# Patient Record
Sex: Female | Born: 1960
Health system: Southern US, Community
[De-identification: ages and names within clinical notes are randomized; demographics above are authoritative.]

## PROBLEM LIST (undated history)

## (undated) DIAGNOSIS — T8859XA Other complications of anesthesia, initial encounter: Secondary | ICD-10-CM

## (undated) DIAGNOSIS — I499 Cardiac arrhythmia, unspecified: Secondary | ICD-10-CM

## (undated) DIAGNOSIS — T4145XA Adverse effect of unspecified anesthetic, initial encounter: Secondary | ICD-10-CM

## (undated) DIAGNOSIS — L0390 Cellulitis, unspecified: Secondary | ICD-10-CM

## (undated) DIAGNOSIS — M1990 Unspecified osteoarthritis, unspecified site: Secondary | ICD-10-CM

## (undated) DIAGNOSIS — I10 Essential (primary) hypertension: Secondary | ICD-10-CM

## (undated) DIAGNOSIS — I639 Cerebral infarction, unspecified: Secondary | ICD-10-CM

## (undated) DIAGNOSIS — D649 Anemia, unspecified: Secondary | ICD-10-CM

## (undated) DIAGNOSIS — Z9289 Personal history of other medical treatment: Secondary | ICD-10-CM

## (undated) DIAGNOSIS — R112 Nausea with vomiting, unspecified: Secondary | ICD-10-CM

## (undated) DIAGNOSIS — E0590 Thyrotoxicosis, unspecified without thyrotoxic crisis or storm: Secondary | ICD-10-CM

## (undated) DIAGNOSIS — Z87442 Personal history of urinary calculi: Secondary | ICD-10-CM

## (undated) DIAGNOSIS — Z9889 Other specified postprocedural states: Secondary | ICD-10-CM

## (undated) HISTORY — PX: OTHER SURGICAL HISTORY: SHX169

## (undated) HISTORY — PX: CARPAL TUNNEL RELEASE: SHX101

## (undated) HISTORY — PX: ABDOMINAL HYSTERECTOMY: SHX81

## (undated) HISTORY — PX: TONSILLECTOMY: SUR1361

## (undated) HISTORY — PX: KNEE SURGERY: SHX244

---

## 1998-09-02 ENCOUNTER — Ambulatory Visit (HOSPITAL_COMMUNITY): Admission: RE | Admit: 1998-09-02 | Discharge: 1998-09-02 | Payer: Self-pay | Admitting: Gastroenterology

## 1999-10-06 ENCOUNTER — Other Ambulatory Visit: Admission: RE | Admit: 1999-10-06 | Discharge: 1999-10-06 | Payer: Self-pay | Admitting: Obstetrics and Gynecology

## 1999-11-02 ENCOUNTER — Encounter: Admission: RE | Admit: 1999-11-02 | Discharge: 1999-11-02 | Payer: Self-pay | Admitting: Obstetrics and Gynecology

## 1999-11-02 ENCOUNTER — Encounter: Payer: Self-pay | Admitting: Obstetrics and Gynecology

## 2000-11-28 ENCOUNTER — Other Ambulatory Visit: Admission: RE | Admit: 2000-11-28 | Discharge: 2000-11-28 | Payer: Self-pay | Admitting: Obstetrics and Gynecology

## 2000-12-18 ENCOUNTER — Encounter: Payer: Self-pay | Admitting: Obstetrics and Gynecology

## 2000-12-18 ENCOUNTER — Encounter: Admission: RE | Admit: 2000-12-18 | Discharge: 2000-12-18 | Payer: Self-pay | Admitting: Obstetrics and Gynecology

## 2001-08-27 ENCOUNTER — Other Ambulatory Visit: Admission: RE | Admit: 2001-08-27 | Discharge: 2001-08-27 | Payer: Self-pay | Admitting: Obstetrics and Gynecology

## 2001-09-02 ENCOUNTER — Encounter: Payer: Self-pay | Admitting: Obstetrics and Gynecology

## 2001-09-02 ENCOUNTER — Ambulatory Visit (HOSPITAL_COMMUNITY): Admission: RE | Admit: 2001-09-02 | Discharge: 2001-09-02 | Payer: Self-pay | Admitting: Obstetrics and Gynecology

## 2002-12-30 ENCOUNTER — Other Ambulatory Visit: Admission: RE | Admit: 2002-12-30 | Discharge: 2002-12-30 | Payer: Self-pay | Admitting: Obstetrics and Gynecology

## 2003-01-11 ENCOUNTER — Encounter: Admission: RE | Admit: 2003-01-11 | Discharge: 2003-01-11 | Payer: Self-pay | Admitting: Obstetrics and Gynecology

## 2003-01-11 ENCOUNTER — Encounter: Payer: Self-pay | Admitting: Obstetrics and Gynecology

## 2004-03-21 ENCOUNTER — Encounter: Admission: RE | Admit: 2004-03-21 | Discharge: 2004-03-21 | Payer: Self-pay | Admitting: Obstetrics and Gynecology

## 2004-04-07 ENCOUNTER — Other Ambulatory Visit: Admission: RE | Admit: 2004-04-07 | Discharge: 2004-04-07 | Payer: Self-pay | Admitting: Obstetrics and Gynecology

## 2005-05-07 ENCOUNTER — Ambulatory Visit (HOSPITAL_COMMUNITY): Admission: RE | Admit: 2005-05-07 | Discharge: 2005-05-07 | Payer: Self-pay | Admitting: Obstetrics and Gynecology

## 2005-05-09 ENCOUNTER — Other Ambulatory Visit: Admission: RE | Admit: 2005-05-09 | Discharge: 2005-05-09 | Payer: Self-pay | Admitting: Obstetrics and Gynecology

## 2006-05-16 ENCOUNTER — Ambulatory Visit (HOSPITAL_COMMUNITY): Admission: RE | Admit: 2006-05-16 | Discharge: 2006-05-16 | Payer: Self-pay | Admitting: Obstetrics and Gynecology

## 2007-07-03 ENCOUNTER — Ambulatory Visit (HOSPITAL_COMMUNITY): Admission: RE | Admit: 2007-07-03 | Discharge: 2007-07-03 | Payer: Self-pay | Admitting: Obstetrics and Gynecology

## 2007-11-25 ENCOUNTER — Encounter (HOSPITAL_COMMUNITY): Admission: RE | Admit: 2007-11-25 | Discharge: 2007-12-15 | Payer: Self-pay | Admitting: Radiology

## 2007-11-28 ENCOUNTER — Ambulatory Visit: Payer: Self-pay | Admitting: Cardiology

## 2007-12-02 ENCOUNTER — Encounter: Payer: Self-pay | Admitting: Cardiology

## 2007-12-02 ENCOUNTER — Ambulatory Visit (HOSPITAL_COMMUNITY): Admission: RE | Admit: 2007-12-02 | Discharge: 2007-12-02 | Payer: Self-pay | Admitting: Cardiology

## 2007-12-02 ENCOUNTER — Ambulatory Visit: Payer: Self-pay | Admitting: Cardiology

## 2008-07-06 ENCOUNTER — Ambulatory Visit (HOSPITAL_COMMUNITY): Admission: RE | Admit: 2008-07-06 | Discharge: 2008-07-06 | Payer: Self-pay | Admitting: Obstetrics and Gynecology

## 2009-05-05 ENCOUNTER — Emergency Department (HOSPITAL_COMMUNITY): Admission: EM | Admit: 2009-05-05 | Discharge: 2009-05-05 | Payer: Self-pay | Admitting: Emergency Medicine

## 2009-07-12 ENCOUNTER — Ambulatory Visit (HOSPITAL_COMMUNITY): Admission: RE | Admit: 2009-07-12 | Discharge: 2009-07-12 | Payer: Self-pay | Admitting: Obstetrics and Gynecology

## 2009-10-05 ENCOUNTER — Encounter (HOSPITAL_COMMUNITY): Admission: RE | Admit: 2009-10-05 | Discharge: 2009-11-04 | Payer: Self-pay | Admitting: Sports Medicine

## 2009-12-27 ENCOUNTER — Ambulatory Visit (HOSPITAL_COMMUNITY): Admission: RE | Admit: 2009-12-27 | Discharge: 2009-12-27 | Payer: Self-pay | Admitting: Obstetrics and Gynecology

## 2010-04-09 ENCOUNTER — Encounter: Payer: Self-pay | Admitting: Obstetrics and Gynecology

## 2010-04-10 ENCOUNTER — Encounter: Payer: Self-pay | Admitting: Internal Medicine

## 2010-06-01 LAB — COMPREHENSIVE METABOLIC PANEL
Albumin: 3.8 g/dL (ref 3.5–5.2)
CO2: 27 mEq/L (ref 19–32)
Calcium: 9 mg/dL (ref 8.4–10.5)
Creatinine, Ser: 0.75 mg/dL (ref 0.4–1.2)
GFR calc Af Amer: >60 mL/min (ref >60)
Potassium: 3.5 mEq/L (ref 3.5–5.1)
Total Bilirubin: 0.3 mg/dL (ref 0.3–1.2)

## 2010-06-01 LAB — CBC
Hemoglobin: 12.1 g/dL (ref 12.0–15.0)
Platelets: 280 10*3/uL (ref 150–400)
RBC: 3.81 MIL/uL — ABNORMAL LOW (ref 3.87–5.11)

## 2010-06-05 ENCOUNTER — Other Ambulatory Visit (HOSPITAL_COMMUNITY): Payer: Self-pay | Admitting: Obstetrics and Gynecology

## 2010-06-05 DIAGNOSIS — Z139 Encounter for screening, unspecified: Secondary | ICD-10-CM

## 2010-06-07 LAB — URINALYSIS, ROUTINE W REFLEX MICROSCOPIC
Hgb urine dipstick: NEGATIVE
Specific Gravity, Urine: 1.025 (ref 1.005–1.030)
pH: 6 (ref 5.0–8.0)

## 2010-06-07 LAB — URINE MICROSCOPIC-ADD ON

## 2010-06-07 LAB — PREGNANCY, URINE: Preg Test, Ur: NEGATIVE

## 2010-06-26 ENCOUNTER — Ambulatory Visit (HOSPITAL_COMMUNITY)
Admission: AD | Admit: 2010-06-26 | Discharge: 2010-06-27 | Disposition: A | Discharge status: Home / Self Care | Payer: BC Managed Care – PPO | Source: Ambulatory Visit | Attending: Obstetrics and Gynecology | Admitting: Obstetrics and Gynecology

## 2010-06-26 DIAGNOSIS — D649 Anemia, unspecified: Secondary | ICD-10-CM | POA: Insufficient documentation

## 2010-06-26 DIAGNOSIS — N920 Excessive and frequent menstruation with regular cycle: Secondary | ICD-10-CM | POA: Insufficient documentation

## 2010-06-26 DIAGNOSIS — R55 Syncope and collapse: Secondary | ICD-10-CM | POA: Insufficient documentation

## 2010-06-26 LAB — COMPREHENSIVE METABOLIC PANEL
ALT: 15 U/L (ref 0–35)
Chloride: 104 mEq/L (ref 96–112)
Creatinine, Ser: 0.7 mg/dL (ref 0.4–1.2)
GFR calc Af Amer: >60 mL/min (ref >60)
GFR calc non Af Amer: >60 mL/min (ref >60)
Sodium: 136 mEq/L (ref 135–145)
Total Bilirubin: 0.3 mg/dL (ref 0.3–1.2)

## 2010-06-26 LAB — ABO/RH: ABO/RH(D): A POS

## 2010-06-26 LAB — CBC
Hemoglobin: 5.9 g/dL — CL (ref 12.0–15.0)
Platelets: 223 10*3/uL (ref 150–400)
RBC: 2.13 MIL/uL — ABNORMAL LOW (ref 3.87–5.11)

## 2010-06-27 LAB — TYPE AND SCREEN
ABO/RH(D): A POS
Antibody Screen: NEGATIVE
Unit division: 0
Unit division: 0

## 2010-06-27 LAB — HEMOGLOBIN AND HEMATOCRIT, BLOOD
HCT: 21.1 % — ABNORMAL LOW (ref 36.0–46.0)
Hemoglobin: 7.3 g/dL — ABNORMAL LOW (ref 12.0–15.0)

## 2010-06-29 NOTE — H&P (Signed)
[name], [name]                 ACCOUNT NO.:  [id number]  MEDICAL RECORD NO.:  [id number]           PATIENT TYPE:  O  LOCATION:  9304                          FACILITY:  [facility]  PHYSICIAN:  Malachi Pro. Ambrose Mantle, M.D. DATE OF BIRTH:  11-Jan-1961  DATE OF ADMISSION:  06/26/2010 DATE OF DISCHARGE:                             HISTORY & PHYSICAL   HISTORY OF PRESENT ILLNESS:  This is a 50 year old white married female para 3-0-1-3 with abnormal bleeding since January 2012.  The patient was actually scheduled for NovaSure oblation of the uterus in October 2011, but canceled the procedure, never came back to the office for rescheduling and began bleeding in January and has never stopped, but never called the office to seek any counsel until June 26, 2010, when she called stating that she was bleeding extremely heavily.  She came to our office and gave a history that over the last 3 days, she had used 40 pads per day, passing clots as large as a baseball, had become lightheaded, had felt tired and gave a history that she was on Plavix for mini strokes since the mini strokes occurred in 1995.  Her initial medication was Ticlid, but was switched to Plavix subsequently and she has been on that since that time.  She bruises easily, but states she has always done so.  ALLERGIES:  Reveals no known allergies.  PAST SURGICAL HISTORY:  Right knee arthroscopy, T and A, and carpal tunnel syndrome.  PAST MEDICAL HISTORY:  High blood pressure, hyperthyroidism, mini strokes with tunnel vision.  FAMILY HISTORY:  Mother 44 living and well.  Father died at 95 of COPD. Two sisters have hypothyroidism.  One brother has had a stroke because of a hole in his heart.  She is employed in an Scientist, research (physical sciences). Alcohol, tobacco, and drugs none.  MEDICATIONS: 1. Plavix 75 mg a day. 2. Diltiazem 360 mg every day. 3. Naproxen 500 mg a day as needed. 4. Lisinopril/hydrochlorothiazide 20/12.5 once a day. 5.  Lipitor 10 mg once a day.  PHYSICAL EXAM:  VITAL SIGNS:  The blood pressure lying down is 138/88, sitting is 132/84, and standing is 130/82.  Her pulse was initially recorded as 82, but subsequently was 120. GENERAL:  Reveal a very pale white female, in no active distress. HEAD, EYES, EARS, NOSE, AND THROAT:  Normal.  Thyroid is normal in size. LUNGS:  Clear to auscultation. HEART:  Tachycardia at 120 with a 2/6 systolic ejection murmur. ABDOMEN:  Soft and flat, nontender.  No masses are palpable.  Liver, spleen, and kidneys are normal. GENITOURINARY:  The tampon is removed and has a large clot on it.  There is some active bleeding from the cervix, but this is removed and then there is no more bleeding.  The uterus is anterior, possibly slightly upper limit of normal size.  Adnexa are free of masses.  ADMITTING IMPRESSION: 1. Persistent abnormal uterine bleeding with significant menorrhagia. 2. History of mini strokes, on Plavix. 3. History of hyperthyroidism and hypertension.  PLAN:  The patient is admitted to stop her Plavix to be transfused and to consider  hormonal therapy or D and C.     Malachi Pro. Ambrose Mantle, M.D.     TFH/MEDQ  D:  06/26/2010  T:  06/26/2010  Job:  [id number]  Electronically Signed by Tracey Harries M.D. on 06/29/2010 08:44:09 AM

## 2010-06-29 NOTE — Discharge Summary (Signed)
  [name], [name]                 ACCOUNT NO.:  [id number]  MEDICAL RECORD NO.:  [id number]           PATIENT TYPE:  O  LOCATION:  9304                          [facility]  PHYSICIAN:  Malachi Pro. Ambrose Mantle, M.D. DATE OF BIRTH:  [birth year]  DATE OF ADMISSION:  06/26/2010 DATE OF DISCHARGE:  06/27/2010                              DISCHARGE SUMMARY   This is a 49 year old white female, para 3-0-1-3 who was admitted for significant menometrorrhagia with a hemoglobin of 5.9.  The patient's history and physical have been dictated.  She was admitted, having been on Plavix for 15 years or so.  She was admitted with a hemoglobin of 5.9 and after arriving on the floor, actually had a syncopal episode.  She received 2 units of packed red cells.  She was given Duoluton 250 mg IM. The Plavix was stopped and her bleeding has basically stopped.  She received 2 units of packed cells and her hemoglobin initially after the 2 units was 7.2, hematocrit 21.1 and now several hours later, the hemoglobin is 7.3, hematocrit 21.7.  Because of the recent interruption of Plavix, she is not considered an ideal operative candidate and she is being discharged on Provera 10 mg one by mouth every day for 28 days. She is asked to take ferrous sulfate 325 mg by mouth twice a day.  She is asked to call the Neurology office and ask for an appointment to see if she should still be on Plavix.  I have advised her for the next week to stop her diltiazem, Plavix and lisinopril/hydrochlorothiazide as well as naproxen.  She is to return to the office in 1 week for followup examination.  DISCHARGE DIAGNOSES: 1. Menometrorrhagia. 2. Severe anemia.  MEDICATIONS: 1. Provera 10 mg a day for 28 days. 2. Ferrous sulfate 325 mg twice a day. 3. Continue her Lipitor 10 mg a day. 4. Multivitamins once a day.  Call with any heavy bleeding.  Get the Neurology appointment and return to our office in 1 week to discuss future  definitive therapy.  Additional lab work showed a potassium of 3.2, which was treated with intravenous potassium.  SGOT and PT were 21 and 15.  Bilirubin 0.3. Estimated glomerular filtration rate greater than 60.  White count 6800, platelet count 223,000.     Malachi Pro. Ambrose Mantle, M.D.    TFH/MEDQ  D:  06/27/2010  T:  06/27/2010  Job:  [id number]  Electronically Signed by Tracey Harries M.D. on 06/29/2010 08:45:11 AM

## 2010-07-17 ENCOUNTER — Ambulatory Visit (HOSPITAL_COMMUNITY)
Admission: RE | Admit: 2010-07-17 | Discharge: 2010-07-17 | Disposition: A | Discharge status: Home / Self Care | Payer: BC Managed Care – PPO | Source: Ambulatory Visit | Attending: Obstetrics and Gynecology | Admitting: Obstetrics and Gynecology

## 2010-07-17 DIAGNOSIS — Z139 Encounter for screening, unspecified: Secondary | ICD-10-CM

## 2010-07-17 DIAGNOSIS — Z1231 Encounter for screening mammogram for malignant neoplasm of breast: Secondary | ICD-10-CM | POA: Insufficient documentation

## 2010-08-01 NOTE — Assessment & Plan Note (Signed)
[facility]                       [place] [facility] OFFICE NOTE   [name], [name]                        MRN:          [id number]  DATE:11/28/2007                            DOB:          Mar 13, 1961    I was asked by Dr. Gabriel Earing to consult on [name] with some  shortness of breath, lower extremity edema, and hypertension.   She was recently evaluated at [facility].  Laboratory data showed her to  be markedly hyperthyroid.  Her BNP was 81.6.  Rest of her labs were with  a normal limits.  She subsequently underwent a thyroid scan, which shows  Graves disease.  There is no suggestion of a hot or cold nodule.   She is currently 50 years of age.  She has a history of hypertension for  at least 15 years.  She is also struggle with her weight.  She admits to  dietary indiscretion with salt and empty calories.   She has had some problems of lower extremity edema off and on for years.  It has been worse recently.  She also has had some tachy palpitations.  She was started on some propranolol 20 mg p.o. b.i.d., which is helped  the palpitations.   She has had no orthopnea, PND, or chest pain per se.  She had no syncope  or presyncope.   She has lost about 6 pounds of weight.   PAST MEDICAL HISTORY:   ALLERGIES:  She has no known drug allergies.   CURRENT MEDICATIONS:  1. Diltiazem extended release 360 daily, which is a long-term drug.  2. Plavix 75 mg a day.  3. Lisinopril and HCTZ 20/12.5 daily.  4. Lipitor 10 mg a day.  5. Propranolol 20 mg p.o. b.i.d.  6. Iron supplement women's once a day.   She does not smoke or drink.  She enjoys fast food.   PAST SURGICAL HISTORY:  She had tonsillectomy in 1989 and carpal tunnel  surgery in the past.   FAMILY HISTORY:  Positive for hypertension in her mother.   SOCIAL HISTORY:  She is an Airline pilot.  She is married and has 3  children.  [place]   REVIEW  OF SYSTEMS:  Other than HPI is negative except for urinary  frequency.  The rest of her points to care for review of systems are  negative.   PHYSICAL EXAMINATION:  GENERAL:  She is very pleasant quite humorous  young woman, in no acute distress.  VITAL SIGNS:  Her height is 5 feet 5.  She weighs 228.  Her blood  pressure is 98/60, usually runs around 120/80.  Her pulse is 85 and  regular.  Her EKG shows sinus rhythm with no ST-segment changes.  She  has a little bit of poor progression in the anterior precordium and  otherwise, negative.  HEENT:  Normocephalic, atraumatic.  She has a little bit of  exophthalmia.  PERRLA.  Extraocular is intact.  Sclerae are clear.  Face  symmetry is normal.  Dentition satisfactory.  Oral mucosa is normal.  NECK:  Supple.  Carotid upstrokes were equal bilateral without bruits.  Thyroid is palpable both lobes.  It is nontender.  LUNGS:  Clear to auscultation percussion.  HEART:  Poorly appreciated PMI.  She has normal S1 and S2.  No gallop or  rub.  ABDOMEN:  Protuberant.  Good bowel sounds.  No tenderness.  There is no  organomegaly.  EXTREMITIES:  No cyanosis or clubbing and only traced 1+ edema.  Pulses  are intact.  NEUROLOGIC:  Intact.   ASSESSMENT AND PLAN:  1. Hypertension, currently under good control.  2. Palpitations secondary to hyperthyroidism now well controlled low-      dose beta blockade.  3. Obesity.  4. Lower extremity edema, which is multifactorial.  This could have      been exacerbated by her hyperthyroidism.  However, I suspect this      dietary indiscretion in her weight.  Diltiazem, which she had been      on for long time can also be contributing.   PLAN:  1. A 2-D echocardiogram to assess LV and RV function to rule out      pericardial effusion with her hyperthyroidism and also looking      pulmonary pressures if possible.  She probably has some degree of      LVH with her history of hypertension.   I have made no  change in her medical program at present.  I think until  she is euthyroid making changes would be premature.  Her edema is really  not that bad at present.  I will plan on seeing her back in 3 months at  which time we will try to make any final recommendations once her  thyroid abnormality is treated.     Thomas C. Daleen Squibb, MD, [facility]  Electronically Signed    TCW/MedQ  DD: 11/28/2007  DT: 11/29/2007  Job #: [id number]   cc:   Gabriel Earing, M.D.

## 2010-08-15 ENCOUNTER — Other Ambulatory Visit: Payer: Self-pay | Admitting: Obstetrics and Gynecology

## 2010-08-15 ENCOUNTER — Encounter (HOSPITAL_COMMUNITY): Payer: BC Managed Care – PPO

## 2010-08-15 LAB — COMPREHENSIVE METABOLIC PANEL
ALT: 17 U/L (ref 0–35)
Albumin: 3.6 g/dL (ref 3.5–5.2)
CO2: 25 mEq/L (ref 19–32)
Chloride: 102 mEq/L (ref 96–112)
Creatinine, Ser: 0.73 mg/dL (ref 0.4–1.2)
GFR calc non Af Amer: >60 mL/min (ref >60)
Glucose, Bld: 101 mg/dL — ABNORMAL HIGH (ref 70–99)
Potassium: 4 mEq/L (ref 3.5–5.1)
Sodium: 138 mEq/L (ref 135–145)
Total Protein: 6.4 g/dL (ref 6.0–8.3)

## 2010-08-15 LAB — CBC
MCH: 30.2 pg (ref 26.0–34.0)
MCV: 92.2 fL (ref 78.0–100.0)
WBC: 5.1 10*3/uL (ref 4.0–10.5)

## 2010-08-22 ENCOUNTER — Other Ambulatory Visit: Payer: Self-pay | Admitting: Obstetrics and Gynecology

## 2010-08-22 ENCOUNTER — Ambulatory Visit (HOSPITAL_COMMUNITY)
Admission: RE | Admit: 2010-08-22 | Discharge: 2010-08-23 | Disposition: A | Discharge status: Home / Self Care | Payer: BC Managed Care – PPO | Source: Ambulatory Visit | Attending: Obstetrics and Gynecology | Admitting: Obstetrics and Gynecology

## 2010-08-22 DIAGNOSIS — N8300 Follicular cyst of ovary, unspecified side: Secondary | ICD-10-CM | POA: Insufficient documentation

## 2010-08-22 DIAGNOSIS — N8000 Endometriosis of the uterus, unspecified: Secondary | ICD-10-CM | POA: Insufficient documentation

## 2010-08-22 DIAGNOSIS — D251 Intramural leiomyoma of uterus: Secondary | ICD-10-CM | POA: Insufficient documentation

## 2010-08-22 DIAGNOSIS — Z01812 Encounter for preprocedural laboratory examination: Secondary | ICD-10-CM | POA: Insufficient documentation

## 2010-08-22 DIAGNOSIS — D649 Anemia, unspecified: Secondary | ICD-10-CM | POA: Insufficient documentation

## 2010-08-22 DIAGNOSIS — Z01818 Encounter for other preprocedural examination: Secondary | ICD-10-CM | POA: Insufficient documentation

## 2010-08-22 DIAGNOSIS — N920 Excessive and frequent menstruation with regular cycle: Secondary | ICD-10-CM | POA: Insufficient documentation

## 2010-08-22 DIAGNOSIS — N8111 Cystocele, midline: Secondary | ICD-10-CM | POA: Insufficient documentation

## 2010-08-23 LAB — CBC
HCT: 26.1 % — ABNORMAL LOW (ref 36.0–46.0)
MCH: 30.9 pg (ref 26.0–34.0)
MCHC: 33 g/dL (ref 30.0–36.0)
MCV: 93.9 fL (ref 78.0–100.0)
Platelets: 226 10*3/uL (ref 150–400)
RBC: 2.78 MIL/uL — ABNORMAL LOW (ref 3.87–5.11)
RDW: 14.2 % (ref 11.5–15.5)
WBC: 11.8 10*3/uL — ABNORMAL HIGH (ref 4.0–10.5)

## 2010-08-25 NOTE — Op Note (Signed)
[name], [name] NO.:  [id number]  MEDICAL RECORD NO.:  [id number]  LOCATION:  9312                          FACILITY:  [facility]  PHYSICIAN:  Huel Cote, M.D. DATE OF BIRTH:  [birth year]  DATE OF PROCEDURE:  08/22/2010 DATE OF DISCHARGE:                              OPERATIVE REPORT   PREOPERATIVE DIAGNOSES: 1. Menorrhagia 2. Fibroids. 3. Anemia. 4. Cystocele. 5. Rectocele.  POSTOPERATIVE DIAGNOSES: 1. Menorrhagia 2. Fibroids. 3. Anemia. 4. Cystocele. 5. Rectocele.  PROCEDURES: 1. Laparoscopic-assisted vaginal hysterectomy. 2. Bilateral salpingo-oophorectomy. 3. Anterior-posterior repair.  SURGEON:  Huel Cote, MD  ASSISTANT:  Zenaida Niece, MD  ANESTHESIA:  General.  FINDINGS:  The uterus was enlarged to approximately 10 weeks' size with fibroids.  The ovaries and tubes appeared normal.  The remainder of the pelvic and abdominal anatomy were within normal limits.  SPECIMEN:  Uterus, cervix, tubes, and ovaries were sent to Pathology.  ESTIMATED BLOOD LOSS:  800 mL.  URINE OUTPUT:  600 mL, clear urine.  IV FLUIDS:  4200 mL LR.  There were no known complications.  DESCRIPTION OF PROCEDURE:  The patient was taken to the operating room where general anesthesia was obtained without difficulty.  She was then prepped and draped in the normal sterile fashion in the dorsal lithotomy position.  A speculum was placed within the vagina and a Hulka tenaculum was placed for uterine manipulation as well as a Foley catheter to drain the bladder.  After the patient was then draped, attention was then turned to the abdomen where an injection with 0.25% Marcaine was performed at the infraumbilical area.  A small 1-cm incision was then made with a scalpel and the Veress needle introduced into the peritoneal cavity.  This placement was confirmed by aspiration injection with normal saline and the gas flow was applied with the normal  pressure noted of 6.  Pneumoperitoneum was then obtained with approximately 3 liters of CO2 gas.  The Veress needle was then removed and the OptiView trocar 5 mm in size was then utilized to enter the peritoneal cavity under direct visualization.  Once this was placed, abdomen and pelvis were inspected with the findings as previously stated.  Two additional 5- mm ports were placed in the upper quadrants after injection with 0.25% Marcaine under direct visualization with the camera.  Once these ports were in place, the right adnexa was grasped and reflected medially.  The infundibulopelvic ligament was then taken down with the harmonic scalpel as well as the remainder of the broad ligament and the round ligament down to the level of the bladder flap.  The bladder flap was then developed superficially with the harmonic scalpel and the bladder was pushed away from the underlying cervix.  In a similar fashion, the other adnexa was grasped and reflected medially and taken down with the harmonic scalpel in a similar fashion through the infundibulopelvic round ligament and down to the level of the bladder flap.  This was developed to meet the other side at the midline and at this point, there was no active bleeding noted.  All instruments were removed from the abdomen and the trocars left in place with  attention turned vaginally. The Hulka tenaculum removed and a weighted speculum was placed. Jacobson tenaculums were placed on the cervix and a dilute solution of Pitressin was then injected circumferentially around the cervix itself. The Bovie cautery was then utilized to make a circumferential incision and the overlying mucosa of the cervix and this was dissected away with Mayo scissors.  The posterior cul-de-sac was then entered sharply with Mayo scissors and the banana speculum was placed within it.  The anterior cul-de-sac was slightly more difficult to enter, so it was developed somewhat and  pushed away from the cervix itself.  The parametrial clamps were then utilized to clamp the uterosacral ligaments bilaterally.  These were transected and suture ligated with 0 Vicryl. The parametrial clamps were then utilized to continue bilaterally, going up the paracervical tissues up to the level of the previous dissection abdominally, each step was transected and suture ligated with a 0 Vicryl.  Once the previous pedicles had been reached, the uterus was flipped with a towel clamp and was found to be held on by 2 pedicles. These were vascular, so they were clamped with a Haney clamp bilaterally.  The uterus was completely amputated and handed off to Pathology.  These vascular pedicles were then secured with both free ties of 0 Vicryl and a suture ligature of 2-0 Vicryl.  There were some additional areas of bleeding noted further back on the pedicles which were also secured with suture ligatures of 2-0 Vicryl.  Once the pedicles appeared hemostatic and the sponge stick was utilized to view each pedicle and sidewall well and there was no active bleeding noted. The sutures were trimmed and the weighted speculum removed and replacedwith a short speculum.  The posterior cuff was noted to be bleeding and this was secured with a running lock suture of 2-0 Vicryl.  The uterosacral ligaments were reapproximated with a figure-of-eight suture of 0 Vicryl and at this point, all appeared hemostatic.  Attention was then turned to the anterior vaginal cuff which was grasped with Allis clamps and reflected medially.  The mucosa was injected with a dilute solution of Pitressin and a midline incision was made with the Metzenbaum scissors and the mucosa underscored in this area and dissected off the underlying pubovesical fascia.  These flaps were reflected laterally and the pubovesical fascia was dissected off them and reflected back medially.  Once the cystocele was thus reduced, 0 Vicryl pop offs were  utilized to reapproximate the pubovesical fascia along the midline and reduce the cystocele.  The excess mucosa was then trimmed away and the mucosa closed with 2-0 Vicryl in a running locked fashion as well as the vaginal cuff closed in a running locked fashion. Attention was then turned posteriorly where just inside the introitus, the mucosa was grasped with Allis clamps.  A small area was denuded with the Mayo scissors and a midline incision was developed along the posterior wall of the vagina with mucosa underscored.  These flaps were reflected laterally and the rectovaginal fascia was dissected off for flaps and reflected back to the midline and reduced the rectocele.  Once these were adequately dissected, several interrupted sutures of 0 Vicryl were placed to reapproximate the rectovaginal fascia and reduce the rectocele.  Finally, the rectal mucosa was closed with 2-0 Vicryl in a running locked fashion.  All again appeared hemostatic at this point and the vagina was packed with an Estrace coated gauze.  Gloves were then changed and attention was returned to the abdomen  with the camera was once again introduced into the peritoneal cavity with the patient in Trendelenburg.  The vaginal cuff was closely inspected and there was no active bleeding noted there on either of the adnexal pedicles.  There is a small amount of clot in the abdomen which was irrigated and removed and at the conclusion of the procedure, the pressure taken down to 6 with no bleeding noted, although the ureters could not be clearly visualized due to the patient's body habitus the appeared to be well away from the incision areas.  The trocars were then removed under direct visualization and the pneumoperitoneum reduced.  The incisions were closed with 3-0 Vicryl in a subcuticular stitch and Dermabond. Sponge, lap, and needle counts were correct x2 and the patient was taken to the recovery room in good  condition.     Huel Cote, M.D.     KR/MEDQ  D:  08/22/2010  T:  08/23/2010  Job:  [id number]  Electronically Signed by Huel Cote M.D. on 08/25/2010 11:00:13 PM

## 2010-08-25 NOTE — Discharge Summary (Signed)
  [name], [name]                 ACCOUNT NO.:  [id number]  MEDICAL RECORD NO.:  [id number]  LOCATION:  9312                          [facility]ACILITY:  [facility]  PHYSICIAN:  Huel Cote, M.D. DATE OF BIRTH:  02-21-1961  DATE OF ADMISSION:  08/22/2010 DATE OF DISCHARGE:  08/23/2010                              DISCHARGE SUMMARY   DISCHARGE DIAGNOSES: 1. Menorrhagia. 2. Fibroids. 3. Anemia. 4. Cystocele and rectocele. 5. Status post laparoscopic-assisted vaginal hysterectomy with     bilateral salpingo-oophorectomy and an anterior-posterior repair.  DISCHARGE MEDICATIONS:  Motrin 600 mg p.o. every 6 hours.  DISCHARGE FOLLOWUP:  The patient is to follow up in approximately 2 weeks for an incision check.  HOSPITAL COURSE:  The patient is a 50 year old G4, P 3-1-0-3 who came in for a scheduled laparoscopic assisted vaginal hysterectomy, anterior and posterior repair for menorrhagia that had been so significant in the past.  She had required transfusion.  For her full history and physical, please see that previously dictated version.  The patient underwent her surgery uneventfully on August 22, 2010, and did indeed have a laparoscopic- assisted vaginal hysterectomy with bilateral salpingo-oophorectomy, anterior-posterior repair.  The surgery was uneventful except for an estimated blood loss of approximately 800 mL and the patient tolerated all well.  She was then admitted for routine postoperative care.  On postop day #1, the patient was ambulating, voiding without difficulty, tolerating a regular diet and feeling overall quite well.  She was afebrile with stable vital signs.  Urine output had been excellent just prior to Foley catheter removal at 1350 mL in the last shift.  Abdomen was soft and nontender.  Her incisions were clear and well approximated. Her vaginal packing was removed and she was instructed on pelvic rest and postoperative care.  She was also instructed to call the office  to schedule a followup visit in 2 weeks and given prescriptions for Motrin 600 mg p.o. over 6 hours.     Huel Cote, M.D.     KR/MEDQ  D:  08/23/2010  T:  08/24/2010  Job:  [id number]  Electronically Signed by Huel Cote M.D. on 08/25/2010 11:00:15 PM

## 2010-08-25 NOTE — H&P (Addendum)
NAME:  [name], [name] NO.:  [id number]  MEDICAL RECORD NO.:  [id number]  LOCATION:  [facility]                           FACILITY:  [facility]  PHYSICIAN:  Huel Cote, M.D. DATE OF BIRTH:  [birth year]  DATE OF ADMISSION:  08/15/2010 DATE OF DISCHARGE:                             HISTORY & PHYSICAL   HISTORY OF PRESENT ILLNESS:  The patient is a 50 year old G4, P 3-0-1-3 who is coming in for a scheduled laparoscopic-assisted vaginal hysterectomy and bilateral salpingo-oophorectomy with anterior-posterior repair.  The patient has a long history of menorrhagia for which she has been evaluated in the past and had initially considered a NovaSure procedure, but ultimately decided not to do that last year.  In April 2012, the patient called for essential vaginal bleeding that was so heavy she had bled down to a hemoglobin of 6.4.  At that point, she was admitted to the hospital and had a transfusion with 2 units of packed red blood cells with her hemoglobin improving to 7.3.  She was also placed on Provera 10 mg p.o. daily and the bleeding subsided with this. She was on Plavix and Neurology gave the okay for her to discontinue this as well and with all of those interventions, the patient's bleeding was brought under control and she was discharged for outpatient followup.  She was seen and continued to have some persistent spotting, however, stayed on the Provera and this was able to control her vaginal bleeding so that she could attend her son's wedding in May 2012.  We discussed all of her options including NovaSure and hysterectomy and the patient has decided that she wishes to proceed with definitive surgical therapy with a hysterectomy and ovary removal as well.  She also has a moderate cystocele and rectocele which would be repaired at the same time as the surgery but no symptoms of stress urinary incontinence or urinary issues.  PAST MEDICAL HISTORY:  Significant for  chronic hypertension, hypercholesterolemia, and hyperthyroidism.  She also had a history of TIAs with mini strokes and no residual effects for which she had been on Plavix and has now discontinued this.  PAST OBSTETRICAL HISTORY:  Significant for 3 vaginal deliveries.  PAST GYNECOLOGICAL HISTORY:  Significant for no abnormal Pap smears. She had a workup performed of her menorrhagia which has revealed a fibroid uterus as well as possible adenomyosis.  She had an endometrial biopsy performed in August 2011 which was normal.  ALLERGIES:  None.  MEDICATIONS:  Currently include lisinopril, Lipitor, and Provera.  PAST FAMILY HISTORY:  Significant for breast cancer in 2 aunts in their 17s and for this reason she is hesitant to take any long-term hormonal therapy.  She has a sister with breast cancer in her 35s as well.  There is no colon cancer but there are polyps in her mother and father and heart disease in her grandfather.  PHYSICAL EXAMINATION:  VITAL SIGNS:  Her current weight is 240 pounds, height is 5 feet 5 inches, and blood pressure is 128/85. CARDIAC:  Regular rate and rhythm. LUNGS:  Clear. ABDOMEN:  Soft and nontender. GU:  She was normal external genitalia and a cervix that  has no lesions. Uterus is upper limits of normal in size, measuring approximately 8-10 weeks' size.  She also has a moderate cystocele and rectocele noted on her pelvic exam.  ASSESSMENT AND PLAN:  The risks and benefits of surgery were discussed with the patient in detail including bleeding, infection, and possible damage to bowel and bladder.  The patient understands these risks.  She also stands that she would need a larger abdominal incision should any complication arise as well as be having a prolonged recovery related to this.  She has definitively decided she wants her ovaries removed.  She understands that this will render her menopausal and that she will be symptomatic likely from that.   After carefully considering her options and the discussion of the surgery in detail, the patient desires to proceed as stated.     Huel Cote, M.D.     KR/MEDQ  D:  08/21/2010  T:  08/21/2010  Job:  [id number]  Electronically Signed by Huel Cote M.D. on 08/25/2010 11:00:17 PM

## 2011-07-03 ENCOUNTER — Other Ambulatory Visit (HOSPITAL_COMMUNITY): Payer: Self-pay | Admitting: Obstetrics and Gynecology

## 2011-07-03 DIAGNOSIS — Z139 Encounter for screening, unspecified: Secondary | ICD-10-CM

## 2011-07-19 ENCOUNTER — Ambulatory Visit (HOSPITAL_COMMUNITY)
Admission: RE | Admit: 2011-07-19 | Discharge: 2011-07-19 | Disposition: A | Discharge status: Home / Self Care | Payer: BC Managed Care – PPO | Source: Ambulatory Visit | Attending: Obstetrics and Gynecology | Admitting: Obstetrics and Gynecology

## 2011-07-19 DIAGNOSIS — Z1231 Encounter for screening mammogram for malignant neoplasm of breast: Secondary | ICD-10-CM | POA: Insufficient documentation

## 2011-07-19 DIAGNOSIS — Z139 Encounter for screening, unspecified: Secondary | ICD-10-CM

## 2012-07-04 ENCOUNTER — Other Ambulatory Visit: Payer: Self-pay

## 2012-07-04 DIAGNOSIS — Z1231 Encounter for screening mammogram for malignant neoplasm of breast: Secondary | ICD-10-CM

## 2012-08-08 ENCOUNTER — Ambulatory Visit: Payer: BC Managed Care – PPO

## 2012-08-18 ENCOUNTER — Ambulatory Visit
Admission: RE | Admit: 2012-08-18 | Discharge: 2012-08-18 | Disposition: A | Discharge status: Home / Self Care | Payer: BC Managed Care – PPO | Source: Ambulatory Visit

## 2012-08-18 DIAGNOSIS — Z1231 Encounter for screening mammogram for malignant neoplasm of breast: Secondary | ICD-10-CM

## 2012-08-19 ENCOUNTER — Other Ambulatory Visit: Payer: Self-pay | Admitting: Obstetrics and Gynecology

## 2012-08-19 DIAGNOSIS — R928 Other abnormal and inconclusive findings on diagnostic imaging of breast: Secondary | ICD-10-CM

## 2012-08-25 ENCOUNTER — Ambulatory Visit
Admission: RE | Admit: 2012-08-25 | Discharge: 2012-08-25 | Disposition: A | Discharge status: Home / Self Care | Payer: BC Managed Care – PPO | Source: Ambulatory Visit | Attending: Obstetrics and Gynecology | Admitting: Obstetrics and Gynecology

## 2012-08-25 DIAGNOSIS — R928 Other abnormal and inconclusive findings on diagnostic imaging of breast: Secondary | ICD-10-CM

## 2013-01-21 ENCOUNTER — Emergency Department (HOSPITAL_COMMUNITY): Payer: BC Managed Care – PPO

## 2013-01-21 ENCOUNTER — Emergency Department (HOSPITAL_COMMUNITY)
Admission: EM | Admit: 2013-01-21 | Discharge: 2013-01-21 | Disposition: A | Discharge status: Home / Self Care | Payer: BC Managed Care – PPO | Attending: Emergency Medicine | Admitting: Emergency Medicine

## 2013-01-21 ENCOUNTER — Encounter (HOSPITAL_COMMUNITY): Payer: Self-pay | Admitting: Emergency Medicine

## 2013-01-21 DIAGNOSIS — N12 Tubulo-interstitial nephritis, not specified as acute or chronic: Secondary | ICD-10-CM

## 2013-01-21 DIAGNOSIS — N200 Calculus of kidney: Secondary | ICD-10-CM

## 2013-01-21 DIAGNOSIS — I10 Essential (primary) hypertension: Secondary | ICD-10-CM | POA: Insufficient documentation

## 2013-01-21 DIAGNOSIS — Z8673 Personal history of transient ischemic attack (TIA), and cerebral infarction without residual deficits: Secondary | ICD-10-CM | POA: Insufficient documentation

## 2013-01-21 DIAGNOSIS — R112 Nausea with vomiting, unspecified: Secondary | ICD-10-CM | POA: Insufficient documentation

## 2013-01-21 DIAGNOSIS — Z79899 Other long term (current) drug therapy: Secondary | ICD-10-CM | POA: Insufficient documentation

## 2013-01-21 HISTORY — DX: Essential (primary) hypertension: I10

## 2013-01-21 HISTORY — DX: Cerebral infarction, unspecified: I63.9

## 2013-01-21 LAB — CBC WITH DIFFERENTIAL/PLATELET
Basophils Absolute: 0 10*3/uL (ref 0.0–0.1)
Eosinophils Absolute: 0 10*3/uL (ref 0.0–0.7)
Lymphocytes Relative: 6 % — ABNORMAL LOW (ref 12–46)
Lymphs Abs: 0.8 10*3/uL (ref 0.7–4.0)
MCH: 31.5 pg (ref 26.0–34.0)
Neutrophils Relative %: 89 % — ABNORMAL HIGH (ref 43–77)
Platelets: 254 10*3/uL (ref 150–400)
RBC: 4.99 MIL/uL (ref 3.87–5.11)
WBC: 13.7 10*3/uL — ABNORMAL HIGH (ref 4.0–10.5)

## 2013-01-21 LAB — URINE MICROSCOPIC-ADD ON

## 2013-01-21 LAB — URINALYSIS, ROUTINE W REFLEX MICROSCOPIC
Nitrite: POSITIVE — AB
Specific Gravity, Urine: 1.02 (ref 1.005–1.030)
Urobilinogen, UA: 1 mg/dL (ref 0.0–1.0)

## 2013-01-21 LAB — BASIC METABOLIC PANEL
GFR calc non Af Amer: >90 mL/min (ref >90)
Glucose, Bld: 141 mg/dL — ABNORMAL HIGH (ref 70–99)
Potassium: 4.2 mEq/L (ref 3.5–5.1)
Sodium: 141 mEq/L (ref 135–145)

## 2013-01-21 MED ORDER — IOHEXOL 300 MG/ML  SOLN
50.0000 mL | Freq: Once | INTRAMUSCULAR | Status: AC | PRN
  Administered 2013-01-21: 50 mL via ORAL

## 2013-01-21 MED ORDER — IOHEXOL 300 MG/ML  SOLN
100.0000 mL | Freq: Once | INTRAMUSCULAR | Status: AC | PRN
  Administered 2013-01-21: 100 mL via INTRAVENOUS

## 2013-01-21 MED ORDER — CIPROFLOXACIN HCL 500 MG PO TABS: 500.0000 mg | ORAL_TABLET | Freq: Two times a day (BID) | ORAL | Status: DC

## 2013-01-21 MED ORDER — CIPROFLOXACIN HCL 250 MG PO TABS
500.0000 mg | ORAL_TABLET | Freq: Once | ORAL | Status: AC
  Administered 2013-01-21: 500 mg via ORAL
  Filled 2013-01-21: qty 2

## 2013-01-21 MED ORDER — HYDROCODONE-ACETAMINOPHEN 5-325 MG PO TABS: ORAL_TABLET | ORAL | Status: DC

## 2013-01-21 MED ORDER — MORPHINE SULFATE 4 MG/ML IJ SOLN
4.0000 mg | Freq: Once | INTRAMUSCULAR | Status: DC
  Filled 2013-01-21: qty 1

## 2013-01-21 MED ORDER — SODIUM CHLORIDE 0.9 % IV SOLN
Freq: Once | INTRAVENOUS | Status: AC
  Administered 2013-01-21: 12:00:00 via INTRAVENOUS

## 2013-01-21 MED ORDER — ONDANSETRON HCL 4 MG/2ML IJ SOLN
4.0000 mg | Freq: Once | INTRAMUSCULAR | Status: AC
  Administered 2013-01-21: 4 mg via INTRAMUSCULAR
  Filled 2013-01-21: qty 2

## 2013-01-21 NOTE — ED Provider Notes (Signed)
CSN: [id number]     Arrival date & time 01/21/13  1002 History   First MD Initiated Contact with Patient 01/21/13 1046     Chief Complaint  Patient presents with  . Urinary Tract Infection  . Abdominal Pain   (Consider location/radiation/quality/duration/timing/severity/associated sxs/prior Treatment) HPI Comments: Patient c/o sudden onset of RLQ pain that began yesterday.  Noticed decreased urine output yesterday and nausea and chills.  She states she had one episode of vomiting last evening and that her urine was very dark and cloudy.  She states that she started taking AZO earlier today without relief of symptoms.  She denies vaginal discharge or bleeding, back pain, fever or history of previous kidney stones.    Patient is a 52 y.o. female presenting with urinary tract infection and abdominal pain. The history is provided by the patient.  Urinary Tract Infection This is a new problem. The current episode started yesterday. The problem occurs constantly. The problem has been unchanged. Associated symptoms include abdominal pain, nausea and vomiting. Pertinent negatives include no arthralgias, chest pain, diaphoresis, fever, headaches, joint swelling, neck pain, numbness, rash, sore throat, swollen glands or weakness. Nothing aggravates the symptoms. She has tried nothing for the symptoms. The treatment provided no relief.  Abdominal Pain Pain location:  RLQ Pain quality: pressure and sharp   Pain radiates to:  Does not radiate Pain severity:  Moderate Onset quality:  Sudden Duration:  1 day Timing:  Constant Progression:  Waxing and waning Chronicity:  New Context: not previous surgeries and not sick contacts   Relieved by:  Nothing Worsened by:  Nothing tried Ineffective treatments:  None tried Associated symptoms: dysuria, hematuria, nausea and vomiting   Associated symptoms: no chest pain, no constipation, no diarrhea, no fever, no hematemesis, no shortness of breath, no sore  throat, no vaginal bleeding and no vaginal discharge     Past Medical History  Diagnosis Date  . Hypertension   . Stroke    Past Surgical History  Procedure Laterality Date  . Knee surgery    . Tonsillectomy    . Carpal tunnel release    . Abdominal hysterectomy     No family history on file. History  Substance Use Topics  . Smoking status: Never Smoker   . Smokeless tobacco: Not on file  . Alcohol Use: No   OB History   Grav Para Term Preterm Abortions TAB SAB Ect Mult Living                 Review of Systems  Constitutional: Negative for fever, diaphoresis, activity change and appetite change.  HENT: Negative for sore throat.   Respiratory: Negative for chest tightness and shortness of breath.   Cardiovascular: Negative for chest pain.  Gastrointestinal: Positive for nausea, vomiting and abdominal pain. Negative for diarrhea, constipation, blood in stool and hematemesis.  Genitourinary: Positive for dysuria, frequency, hematuria, decreased urine volume and difficulty urinating. Negative for flank pain, vaginal bleeding, vaginal discharge and pelvic pain.  Musculoskeletal: Negative for arthralgias, joint swelling and neck pain.  Skin: Negative for rash.  Neurological: Negative for dizziness, speech difficulty, weakness, numbness and headaches.  All other systems reviewed and are negative.    Allergies  Review of patient's allergies indicates no known allergies.  Home Medications   Current Outpatient Rx  Name  Route  Sig  Dispense  Refill  . atorvastatin (LIPITOR) 10 MG tablet   Oral   Take 10 mg by mouth daily.         Marland Kitchen  Cholecalciferol (VITAMIN D-3) 1000 UNITS CAPS   Oral   Take 1 capsule by mouth daily.         . clopidogrel (PLAVIX) 75 MG tablet   Oral   Take 75 mg by mouth daily with breakfast.         . lisinopril-hydrochlorothiazide (PRINZIDE,ZESTORETIC) 20-12.5 MG per tablet   Oral   Take 1 tablet by mouth daily.         . ciprofloxacin  (CIPRO) 500 MG tablet   Oral   Take 1 tablet (500 mg total) by mouth 2 (two) times daily.   20 tablet   0   . HYDROcodone-acetaminophen (NORCO/VICODIN) 5-325 MG per tablet      Take one-two tabs po q 4-6 hrs prn pain   20 tablet   0    BP 138/79  Pulse 101  Temp(Src) 98.5 F (36.9 C) (Oral)  Resp 22  Ht 5' 4.5" (1.638 m)  Wt 245 lb (111.131 kg)  BMI 41.42 kg/m2  SpO2 98% Physical Exam  Nursing note and vitals reviewed. Constitutional: She is oriented to person, place, and time. She appears well-developed and well-nourished. No distress.  HENT:  Head: Normocephalic and atraumatic.  Mouth/Throat: Oropharynx is clear and moist.  Neck: Normal range of motion. Neck supple.  Cardiovascular: Normal rate, regular rhythm, normal heart sounds and intact distal pulses.   No murmur heard. Pulmonary/Chest: Effort normal and breath sounds normal. No respiratory distress.  Abdominal: Soft. She exhibits no distension. There is tenderness in the right lower quadrant. There is no rigidity, no rebound, no guarding, no CVA tenderness and no tenderness at McBurney's point.    Localized tenderness to palpation of the right lower quadrant. Abdomen is soft, no guarding or rebound tenderness. no peritoneal signs. No CVA tenderness.  Musculoskeletal: Normal range of motion.  Lymphadenopathy:    She has no cervical adenopathy.  Neurological: She is alert and oriented to person, place, and time. She exhibits normal muscle tone. Coordination normal.  Skin: Skin is warm and dry.    ED Course  Procedures (including critical care time) Labs Review Labs Reviewed  CBC WITH DIFFERENTIAL - Abnormal; Notable for the following:    WBC 13.7 (*)    Hemoglobin 15.7 (*)    Neutrophils Relative % 89 (*)    Neutro Abs 12.2 (*)    Lymphocytes Relative 6 (*)    All other components within normal limits  BASIC METABOLIC PANEL - Abnormal; Notable for the following:    Glucose, Bld 141 (*)    Calcium 10.7 (*)     All other components within normal limits  URINALYSIS, ROUTINE W REFLEX MICROSCOPIC - Abnormal; Notable for the following:    Color, Urine AMBER (*)    APPearance HAZY (*)    Glucose, UA 100 (*)    Hgb urine dipstick LARGE (*)    Protein, ur >300 (*)    Nitrite POSITIVE (*)    Leukocytes, UA MODERATE (*)    All other components within normal limits  URINE MICROSCOPIC-ADD ON - Abnormal; Notable for the following:    Bacteria, UA MANY (*)    All other components within normal limits  URINE CULTURE   Imaging Review Ct Abdomen Pelvis W Contrast  01/21/2013   CLINICAL DATA:  Urinary retention.  EXAM: CT ABDOMEN AND PELVIS WITH CONTRAST  TECHNIQUE: Multidetector CT imaging of the abdomen and pelvis was performed using the standard protocol following bolus administration of intravenous contrast.  CONTRAST:  50mL OMNIPAQUE IOHEXOL 300 MG/ML SOLN, OMNIPAQUE IOHEXOL 300 MG/ML SOLN  COMPARISON:  05/05/2009  FINDINGS: Stable punctate lung nodule at the left lung base on image 16. This is likely a benign etiology based on the stability. Negative for free air.  There is a slightly dense area at the dome of the left hepatic lobe. Findings are nonspecific and could represent a incidental perfusion anomaly. Otherwise, normal appearance of the liver. There is a small nodular density along the wall of the gallbladder could represent stone or polyp. Portal venous system is patent. Normal appearance of the pancreas, spleen and adrenal glands. Normal appearance of the left kidney with an extrarenal pelvis. No significant left hydronephrosis.  There is a 9 mm stone in the distal right ureter just proximal to the ureterovesical junction. There is severe dilatation of the right ureter and right renal pelvis. Mild perinephric edema. Right cortical cysts. Incidentally, there is a retro aortic left renal vein.  Uterus has been removed. No gross abnormality to the urinary bladder. Small periumbilical hernia containing  fat.  Normal appearance of small and large bowel. Normal appearance of the appendix. Bilateral pars defects at L5. Severe disc space disease at L3 through L5 with large posterior osteophyte at L3-L4. Grade 1 anterolisthesis at L5-S1.  IMPRESSION: Severe dilatation of the right renal pelvis and right ureter due to a 9 mm stone in the distal right ureter. Mild right perinephric edema.  Severe degenerative disc disease in the lower lumbar spine.  Question gallstone or gallbladder polyp.   Electronically Signed   By: Richarda Overlie M.D.   On: 01/21/2013 14:06    EKG Interpretation   None       MDM   1. Pyelonephritis   2. Kidney stone      Patient with history of urinary hesitancy and hematuria with sudden onset of right lower quadrant pain. She is otherwise well-appearing and nontoxic. Vital signs are stable. Lab results discussed with patient and CT imaging of the abdomen and pelvis confirmed a 9 mm distal right ureteral stone. I will consult urology.  Patient was offered IV Morphine during ED stay, but declined pain medication.    1510  consult to Dr. Retta Diones.  Recommends Cipro and he will see patient in his office tomorrow for follow up.  Also advised patient to come to [facility] if she were to develop worsening symptoms. Patient agrees to care plan and verbalized understanding.   Brisa Auth L. Finnis Colee, PA-C 01/21/13 1710

## 2013-01-21 NOTE — ED Notes (Signed)
Pt reports urinary retention that started yesterday. Started on azo., this am began having right lower quad ab pain, +nausea, chills. No fever. No diarrhea. No vaginal discharge. No back pain.

## 2013-01-21 NOTE — ED Notes (Signed)
Pt alert & oriented x4, stable gait. Patient given discharge instructions, paperwork & prescription(s). Patient  instructed to stop at the registration desk to finish any additional paperwork. Patient verbalized understanding. Pt left department w/ no further questions. 

## 2013-01-21 NOTE — ED Provider Notes (Signed)
Medical screening examination/treatment/procedure(s) were performed by non-physician practitioner and as supervising physician I was immediately available for consultation/collaboration.  EKG Interpretation   None         Arletha Marschke B. Claudean Leavelle, MD 01/21/13 1814 

## 2013-01-22 ENCOUNTER — Encounter (HOSPITAL_COMMUNITY)
Admission: RE | Disposition: A | Discharge status: Home / Self Care | Payer: Self-pay | Source: Ambulatory Visit | Attending: Urology

## 2013-01-22 ENCOUNTER — Other Ambulatory Visit: Payer: Self-pay | Admitting: Urology

## 2013-01-22 ENCOUNTER — Ambulatory Visit (HOSPITAL_COMMUNITY): Payer: BC Managed Care – PPO | Admitting: Anesthesiology

## 2013-01-22 ENCOUNTER — Encounter (HOSPITAL_COMMUNITY): Payer: BC Managed Care – PPO | Admitting: Anesthesiology

## 2013-01-22 ENCOUNTER — Ambulatory Visit (HOSPITAL_COMMUNITY)
Admission: RE | Admit: 2013-01-22 | Discharge: 2013-01-22 | Disposition: A | Discharge status: Home / Self Care | Payer: BC Managed Care – PPO | Source: Ambulatory Visit | Attending: Urology | Admitting: Urology

## 2013-01-22 ENCOUNTER — Encounter (HOSPITAL_COMMUNITY): Payer: Self-pay | Admitting: *Deleted

## 2013-01-22 DIAGNOSIS — Z8673 Personal history of transient ischemic attack (TIA), and cerebral infarction without residual deficits: Secondary | ICD-10-CM | POA: Insufficient documentation

## 2013-01-22 DIAGNOSIS — E0590 Thyrotoxicosis, unspecified without thyrotoxic crisis or storm: Secondary | ICD-10-CM | POA: Insufficient documentation

## 2013-01-22 DIAGNOSIS — Z79899 Other long term (current) drug therapy: Secondary | ICD-10-CM | POA: Insufficient documentation

## 2013-01-22 DIAGNOSIS — I10 Essential (primary) hypertension: Secondary | ICD-10-CM | POA: Insufficient documentation

## 2013-01-22 DIAGNOSIS — N201 Calculus of ureter: Secondary | ICD-10-CM | POA: Insufficient documentation

## 2013-01-22 DIAGNOSIS — Z7902 Long term (current) use of antithrombotics/antiplatelets: Secondary | ICD-10-CM | POA: Insufficient documentation

## 2013-01-22 DIAGNOSIS — N1330 Unspecified hydronephrosis: Secondary | ICD-10-CM | POA: Insufficient documentation

## 2013-01-22 HISTORY — PX: CYSTOSCOPY WITH STENT PLACEMENT: SHX5790

## 2013-01-22 HISTORY — DX: Adverse effect of unspecified anesthetic, initial encounter: T41.45XA

## 2013-01-22 HISTORY — DX: Nausea with vomiting, unspecified: R11.2

## 2013-01-22 HISTORY — DX: Other specified postprocedural states: Z98.890

## 2013-01-22 HISTORY — DX: Other complications of anesthesia, initial encounter: T88.59XA

## 2013-01-22 LAB — URINE CULTURE: Colony Count: >=100000

## 2013-01-22 SURGERY — CYSTOSCOPY, WITH STENT INSERTION
Anesthesia: General | Laterality: Right | Wound class: Clean Contaminated

## 2013-01-22 MED ORDER — CIPROFLOXACIN IN D5W 400 MG/200ML IV SOLN
400.0000 mg | Freq: Two times a day (BID) | INTRAVENOUS | Status: DC
  Administered 2013-01-22: 400 mg via INTRAVENOUS

## 2013-01-22 MED ORDER — LACTATED RINGERS IV SOLN
INTRAVENOUS | Status: DC | PRN
  Administered 2013-01-22: 16:00:00 via INTRAVENOUS

## 2013-01-22 MED ORDER — CIPROFLOXACIN HCL 500 MG PO TABS: 500.0000 mg | ORAL_TABLET | Freq: Two times a day (BID) | ORAL | Status: AC

## 2013-01-22 MED ORDER — MIDAZOLAM HCL 5 MG/5ML IJ SOLN
INTRAMUSCULAR | Status: DC | PRN
  Administered 2013-01-22: 1 mg via INTRAVENOUS

## 2013-01-22 MED ORDER — IOHEXOL 300 MG/ML  SOLN
INTRAMUSCULAR | Status: DC | PRN
  Administered 2013-01-22: 10 mL
  Administered 2013-01-22: 30 mL

## 2013-01-22 MED ORDER — LACTATED RINGERS IV SOLN: INTRAVENOUS | Status: DC

## 2013-01-22 MED ORDER — SODIUM CHLORIDE 0.9 % IR SOLN
Status: DC | PRN
  Administered 2013-01-22: 3000 mL via INTRAVESICAL

## 2013-01-22 MED ORDER — CIPROFLOXACIN IN D5W 400 MG/200ML IV SOLN
INTRAVENOUS | Status: AC
  Filled 2013-01-22: qty 200

## 2013-01-22 MED ORDER — BELLADONNA ALKALOIDS-OPIUM 16.2-60 MG RE SUPP
RECTAL | Status: DC | PRN
  Administered 2013-01-22: 1 via RECTAL

## 2013-01-22 MED ORDER — FENTANYL CITRATE 0.05 MG/ML IJ SOLN
INTRAMUSCULAR | Status: DC | PRN
  Administered 2013-01-22: 25 ug via INTRAVENOUS
  Administered 2013-01-22: 75 ug via INTRAVENOUS
  Administered 2013-01-22 (×3): 50 ug via INTRAVENOUS

## 2013-01-22 MED ORDER — KETAMINE HCL 10 MG/ML IJ SOLN
INTRAMUSCULAR | Status: DC | PRN
  Administered 2013-01-22: 20 mg via INTRAVENOUS

## 2013-01-22 MED ORDER — FENTANYL CITRATE 0.05 MG/ML IJ SOLN: 25.0000 ug | INTRAMUSCULAR | Status: DC | PRN

## 2013-01-22 MED ORDER — EPHEDRINE SULFATE 50 MG/ML IJ SOLN
INTRAMUSCULAR | Status: DC | PRN
  Administered 2013-01-22: 5 mg via INTRAVENOUS

## 2013-01-22 MED ORDER — LIDOCAINE HCL 2 % EX GEL
CUTANEOUS | Status: AC
  Filled 2013-01-22: qty 10

## 2013-01-22 MED ORDER — LIDOCAINE HCL 2 % EX GEL
CUTANEOUS | Status: DC | PRN
  Administered 2013-01-22: 1 via URETHRAL

## 2013-01-22 MED ORDER — LIDOCAINE HCL (CARDIAC) 20 MG/ML IV SOLN
INTRAVENOUS | Status: DC | PRN
  Administered 2013-01-22: 30 mg via INTRAVENOUS

## 2013-01-22 MED ORDER — DEXAMETHASONE SODIUM PHOSPHATE 4 MG/ML IJ SOLN
INTRAMUSCULAR | Status: DC | PRN
  Administered 2013-01-22: 10 mg via INTRAVENOUS

## 2013-01-22 MED ORDER — BELLADONNA ALKALOIDS-OPIUM 16.2-60 MG RE SUPP
RECTAL | Status: AC
  Filled 2013-01-22: qty 1

## 2013-01-22 MED ORDER — ONDANSETRON HCL 4 MG/2ML IJ SOLN
INTRAMUSCULAR | Status: DC | PRN
  Administered 2013-01-22 (×2): 2 mg via INTRAVENOUS

## 2013-01-22 MED ORDER — PROPOFOL 10 MG/ML IV BOLUS
INTRAVENOUS | Status: DC | PRN
  Administered 2013-01-22: 200 mg via INTRAVENOUS
  Administered 2013-01-22: 50 mg via INTRAVENOUS
  Administered 2013-01-22: 20 mg via INTRAVENOUS

## 2013-01-22 MED ORDER — TROSPIUM CHLORIDE ER 60 MG PO CP24: 60.0000 mg | ORAL_CAPSULE | Freq: Every day | ORAL | Status: DC

## 2013-01-22 MED ORDER — TAMSULOSIN HCL 0.4 MG PO CAPS: 0.4000 mg | ORAL_CAPSULE | Freq: Every day | ORAL | Status: DC

## 2013-01-22 MED ORDER — PHENAZOPYRIDINE HCL 200 MG PO TABS: 200.0000 mg | ORAL_TABLET | Freq: Three times a day (TID) | ORAL | Status: DC | PRN

## 2013-01-22 MED ORDER — LACTATED RINGERS IV SOLN
INTRAVENOUS | Status: DC
  Administered 2013-01-22: 1000 mL via INTRAVENOUS

## 2013-01-22 SURGICAL SUPPLY — 18 items
BAG URO CATCHER STRL LF (DRAPE) ×2 IMPLANT
BASKET ZERO TIP NITINOL 2.4FR (BASKET) ×2 IMPLANT
CATH URET 5FR 28IN CONE TIP (BALLOONS)
CATH URET 5FR 28IN OPEN ENDED (CATHETERS) ×2 IMPLANT
CATH URET 5FR 70CM CONE TIP (BALLOONS) IMPLANT
CLOTH BEACON ORANGE TIMEOUT ST (SAFETY) ×2 IMPLANT
DRAPE CAMERA CLOSED 9X96 (DRAPES) ×2 IMPLANT
FIBER LASER FLEXIVA 365 (UROLOGICAL SUPPLIES) ×2 IMPLANT
GLOVE BIO SURGEON STRL SZ7.5 (GLOVE) ×2 IMPLANT
GLOVE BIOGEL M 8.0 STRL (GLOVE) IMPLANT
GOWN PREVENTION PLUS XLARGE (GOWN DISPOSABLE) IMPLANT
GOWN STRL REIN XL XLG (GOWN DISPOSABLE) ×2 IMPLANT
GUIDEWIRE STR DUAL SENSOR (WIRE) ×4 IMPLANT
MANIFOLD NEPTUNE II (INSTRUMENTS) ×2 IMPLANT
PACK CYSTO (CUSTOM PROCEDURE TRAY) ×2 IMPLANT
STENT CONTOUR 6FRX24X.038 (STENTS) ×2 IMPLANT
TUBING CONNECTING 10 (TUBING) ×2 IMPLANT
WIRE COONS/BENSON .038X145CM (WIRE) IMPLANT

## 2013-01-22 NOTE — Transfer of Care (Signed)
Immediate Anesthesia Transfer of Care Note  Patient: [name]  Procedure(s) Performed: Procedure(s): CYSTOSCOPY RIGHT RETROGRADE PYLEOGRAM  RIGHT URETEROSCOPY, HOLMIUM LASER WITH RIGHT STENT PLACEMENT (Right)  Patient Location: PACU  Anesthesia Type:General  Level of Consciousness: awake, alert , oriented and patient cooperative  Airway & Oxygen Therapy: Patient Spontanous Breathing and Patient connected to face mask oxygen  Post-op Assessment: Report given to PACU RN and Post -op Vital signs reviewed and stable  Post vital signs: stable  Complications: No apparent anesthesia complications

## 2013-01-22 NOTE — Anesthesia Preprocedure Evaluation (Addendum)
Anesthesia Evaluation  Patient identified by MRN, date of birth, ID band Patient awake    Reviewed: Allergy & Precautions, H&P , NPO status , Patient's Chart, lab work & pertinent test results  History of Anesthesia Complications (+) PONV  Airway Mallampati: II TM Distance: >3 FB Neck ROM: full    Dental no notable dental hx. (+) Teeth Intact and Dental Advisory Given   Pulmonary neg pulmonary ROS,  breath sounds clear to auscultation  Pulmonary exam normal       Cardiovascular Exercise Tolerance: Good hypertension, Pt. on medications Rhythm:regular Rate:Normal     Neuro/Psych CVA, No Residual Symptoms negative psych ROS   GI/Hepatic negative GI ROS, Neg liver ROS,   Endo/Other  negative endocrine ROSMorbid obesity  Renal/GU negative Renal ROS  negative genitourinary   Musculoskeletal   Abdominal (+) + obese,   Peds  Hematology negative hematology ROS (+)   Anesthesia Other Findings   Reproductive/Obstetrics negative OB ROS                         Anesthesia Physical Anesthesia Plan  ASA: III  Anesthesia Plan: General   Post-op Pain Management:    Induction: Intravenous  Airway Management Planned: LMA  Additional Equipment:   Intra-op Plan:   Post-operative Plan:   Informed Consent: I have reviewed the patients History and Physical, chart, labs and discussed the procedure including the risks, benefits and alternatives for the proposed anesthesia with the patient or authorized representative who has indicated his/her understanding and acceptance.   Dental Advisory Given  Plan Discussed with: CRNA and Surgeon  Anesthesia Plan Comments:         Anesthesia Quick Evaluation

## 2013-01-22 NOTE — H&P (Signed)
Reason For Visit 9mm right distal ureteral stone   History of Present Illness 52 year old female who presents in follow-up from the emergency department at [facility] she was found to have a 9 mm right distal ureteral stone.    Patient c/o sudden onset of RLQ pain that began yesterday. Noticed decreased urine output yesterday and nausea and chills. She states she had one episode of vomiting 2 nights and that her urine was very dark and cloudy. She states that she started taking AZO without relief of symptoms. She denies any fevers. Her main complaint today is of urinary frequency and incontinence.    She denies vaginal discharge or bleeding, back pain, fever or history of previous kidney stones. Patient is on Plavix for a TIA in her 30s secondary to birth control contraception.    In addition, labs were obtained in the ED.  BUN/creatinine 13/0.74, WBC 13.7  UA: Positive nitrite, moderate leukocyte esterase, WBC/RBC - TNTC. The patient was placed on Cipro for UTI.   Past Medical History Problems  1. History of arthritis (V13.4) 2. History of hypertension (V12.59) 3. History of hyperthyroidism (V12.29) 4. History of stroke (V12.54)  Surgical History Problems  1. History of Hysterectomy 2. History of Knee Surgery Right 3. History of Tonsillectomy 4. History of Wrist Surgery  Current Meds 1. Ciprofloxacin HCl - 500 MG Oral Tablet;  Therapy: (Recorded:06Nov2014) to Recorded 2. Lipitor 10 MG Oral Tablet;  Therapy: (Recorded:06Nov2014) to Recorded 3. Lisinopril-Hydrochlorothiazide 20-12.5 MG Oral Tablet;  Therapy: (Recorded:06Nov2014) to Recorded 4. Plavix 75 MG Oral Tablet;  Therapy: (Recorded:06Nov2014) to Recorded 5. Vitamin D3 1000 UNIT Oral Capsule;  Therapy: (Recorded:06Nov2014) to Recorded  Allergies Medication  1. No Known Drug Allergies  Family History Problems  1. Family history of chronic obstructive pulmonary disease (V17.6) : Father 2. Family  history of diabetes mellitus (V18.0) : Father 3. Family history of prostate cancer (Z61.09) : Father  Social History Problems    Denied: History of Alcohol use   Caffeine use (305.90)   Death in the family, father   COPD age 59   Married   Never a smoker (V49.89)   Occupation   Copywriter, advertising   Three children   3 sons  Review of Systems Genitourinary, constitutional, skin, eye, otolaryngeal, hematologic/lymphatic, cardiovascular, pulmonary, endocrine, musculoskeletal, gastrointestinal, neurological and psychiatric system(s) were reviewed and pertinent findings if present are noted.  Genitourinary: urinary frequency, urinary urgency, dysuria, nocturia, incontinence, urinary hesitancy, urinary stream starts and stops, hematuria, foul smelling urine, cloudy urine and initiating urination requires straining.  Gastrointestinal: nausea and vomiting.  Hematologic/Lymphatic: a tendency to easily bruise.  Cardiovascular: leg swelling.  Musculoskeletal: joint pain.    Vitals Vital Signs [Data Includes: Last 1 Day]  Recorded: 06Nov2014 09:33AM  Height: 5 ft 4.5 in Weight: 245 lb  BMI Calculated: 41.4 BSA Calculated: 2.14 Blood Pressure: 114 / 78 Temperature: 99.7 F Heart Rate: 98  Physical Exam Constitutional: Well nourished and well developed . No acute distress.  ENT:. The ears and nose are normal in appearance.  Neck: The appearance of the neck is normal and no neck mass is present.  Pulmonary: No respiratory distress and normal respiratory rhythm and effort.  Cardiovascular: Heart rate and rhythm are normal.  Genitourinary:  The bladder is normal on palpation.  Lymphatics: The femoral and inguinal nodes are not enlarged or tender.  Skin: Normal skin turgor, no visible rash and no visible skin lesions.  Neuro/Psych:. Mood and affect are appropriate.  Results/Data Urine [Data Includes: Last 1 Day]   06Nov2014  COLOR YELLOW   APPEARANCE CLEAR   SPECIFIC GRAVITY  1.025   pH 6.0   GLUCOSE NEG mg/dL  BILIRUBIN NEG   KETONE NEG mg/dL  BLOOD LARGE   PROTEIN 30 mg/dL  UROBILINOGEN 1 mg/dL  NITRITE NEG   LEUKOCYTE ESTERASE MOD   SQUAMOUS EPITHELIAL/HPF RARE   WBC TNTC WBC/hpf  RBC 3-6 RBC/hpf  BACTERIA FEW   CRYSTALS NONE SEEN   CASTS NONE SEEN    CT of the abdomen and pelvis obtained on 01/21/13: I have independently reviewed the images.   Severe dilatation of the right renal pelvis and right ureter due to  a 9 mm stone in the distal right ureter. Mild right perinephric  edema.     Assessment Assessed  1. Nephrolithiasis (592.0)  Plan Health Maintenance  1. UA With REFLEX; Status:Complete;   Done: 06Nov2014 09:14AM Nephrolithiasis  2. Follow-up Office  Follow-up  Status: Hold For - Date of Service  Requested for:  11Dec2014 3. Follow-up Schedule Surgery Office  Follow-up  Status: Complete  Done: 06Nov2014 4. RENAL U/S RIGHT; Status:Hold For - Date of Service; Requested for:11Dec2014;   Discussion/Summary We discussed management of her obstructing right distal ureteral stone. I think that likelihood of her passing a stone this large is relatively small. I'm also concerned of infection given her most recent UA. I recommended that we proceed to the operating room this afternoon for ureteroscopy and stone extraction. However, I cautioned her that while on Plavix this may complicate things and it may be too much bleeding to proceed with the operation which point we will place a stent and have the patient stop her Plavix and come back in a week. The risks and benefits of the operation were discussed in detail. She has agreed to proceed to the operating room this afternoon.

## 2013-01-22 NOTE — Anesthesia Postprocedure Evaluation (Signed)
  Anesthesia Post-op Note  Patient: [name]  Procedure(s) Performed: Procedure(s) (LRB): CYSTOSCOPY RIGHT RETROGRADE PYLEOGRAM  RIGHT URETEROSCOPY, HOLMIUM LASER WITH RIGHT STENT PLACEMENT (Right)  Patient Location: PACU  Anesthesia Type: General  Level of Consciousness: awake and alert   Airway and Oxygen Therapy: Patient Spontanous Breathing  Post-op Pain: mild  Post-op Assessment: Post-op Vital signs reviewed, Patient's Cardiovascular Status Stable, Respiratory Function Stable, Patent Airway and No signs of Nausea or vomiting  Last Vitals:  Filed Vitals:   01/22/13 1900  BP: 135/85  Pulse: 83  Temp:   Resp: 13    Post-op Vital Signs: stable   Complications: No apparent anesthesia complications

## 2013-01-22 NOTE — Op Note (Signed)
Preoperative diagnosis: right ureteral calculus  Postoperative diagnosis: right ureteral calculus  Procedure:  1. Cystoscopy 2. right ureteroscopy and stone removal 3. Ureteroscopic laser lithotripsy 4. right 74F x 24cm ureteral stent placement  5. right retrograde pyelography with interpretation  Surgeon: Crist Fat, MD  Anesthesia: General  Complications: None  Intraoperative findings: right retrograde pyelography demonstrated a filling defect within the right ureter consistent with the patient's known calculus without other abnormalities.  Severe hydroureteronephrosis.  EBL: Minimal  Specimens: 1. left ureteral calculus  Disposition of specimens: [facility] for stone analysis  Indication: [name] is a 52 y.o.   patient with urolithiasis. After reviewing the management options for treatment, the patient elected to proceed with the above surgical procedure(s). We have discussed the potential benefits and risks of the procedure, side effects of the proposed treatment, the likelihood of the patient achieving the goals of the procedure, and any potential problems that might occur during the procedure or recuperation. Informed consent has been obtained.  Description of procedure:  The patient was taken to the operating room and general anesthesia was induced.  The patient was placed in the dorsal lithotomy position, prepped and draped in the usual sterile fashion, and preoperative antibiotics were administered. A preoperative time-out was performed.   Cystourethroscopy was performed.  The patient's urethra was examined and was normal. The bladder was then systematically examined in its entirety. There was no evidence for any bladder tumors, stones, or other mucosal pathology.    Attention then turned to the right ureteral orifice and a ureteral catheter was used to intubate the ureteral orifice.  Omnipaque contrast was injected through the ureteral  catheter and a retrograde pyelogram was performed with findings as dictated above.  A 0.38 sensor guidewire was then advanced up the right ureter into the renal pelvis under fluoroscopic guidance. The 6 Fr semirigid ureteroscope was then advanced into the ureter next to the guidewire and the calculus was identified.   The stone was then fragmented with the 365 micron holmium laser fiber on a setting of 0.6 and frequency of 6 Hz.   All stones were then removed from the ureter with a zero tip nitinol basket.  Reinspection of the ureter revealed no remaining visible stones or fragments.   The wire was then backloaded through the cystoscope and a ureteral stent was advance over the wire using Seldinger technique.  The stent was positioned appropriately under fluoroscopic and cystoscopic guidance.  The wire was then removed with an adequate stent curl noted in the renal pelvis as well as in the bladder.  The bladder was then emptied and the procedure ended.  The patient appeared to tolerate the procedure well and without complications.  The patient was able to be awakened and transferred to the recovery unit in satisfactory condition.

## 2013-01-23 ENCOUNTER — Encounter (HOSPITAL_COMMUNITY): Payer: Self-pay | Admitting: Urology

## 2013-01-29 ENCOUNTER — Other Ambulatory Visit: Payer: Self-pay | Admitting: Obstetrics and Gynecology

## 2013-01-29 ENCOUNTER — Other Ambulatory Visit: Payer: Self-pay

## 2013-01-29 DIAGNOSIS — N6001 Solitary cyst of right breast: Secondary | ICD-10-CM

## 2013-02-25 ENCOUNTER — Other Ambulatory Visit: Payer: BC Managed Care – PPO

## 2013-02-26 ENCOUNTER — Ambulatory Visit
Admission: RE | Admit: 2013-02-26 | Discharge: 2013-02-26 | Disposition: A | Discharge status: Home / Self Care | Payer: BC Managed Care – PPO | Source: Ambulatory Visit | Attending: Obstetrics and Gynecology | Admitting: Obstetrics and Gynecology

## 2013-02-26 DIAGNOSIS — N6001 Solitary cyst of right breast: Secondary | ICD-10-CM

## 2013-03-19 DIAGNOSIS — I4510 Unspecified right bundle-branch block: Secondary | ICD-10-CM

## 2013-03-19 HISTORY — DX: Unspecified right bundle-branch block: I45.10

## 2013-07-21 ENCOUNTER — Other Ambulatory Visit: Payer: Self-pay | Admitting: Obstetrics and Gynecology

## 2013-07-21 DIAGNOSIS — N6001 Solitary cyst of right breast: Secondary | ICD-10-CM

## 2013-08-12 ENCOUNTER — Other Ambulatory Visit (HOSPITAL_COMMUNITY): Payer: Self-pay | Admitting: Internal Medicine

## 2013-08-12 DIAGNOSIS — E0590 Thyrotoxicosis, unspecified without thyrotoxic crisis or storm: Secondary | ICD-10-CM

## 2013-08-13 ENCOUNTER — Encounter (HOSPITAL_COMMUNITY)
Admission: RE | Admit: 2013-08-13 | Discharge: 2013-08-13 | Disposition: A | Discharge status: Home / Self Care | Payer: BC Managed Care – PPO | Source: Ambulatory Visit | Attending: Internal Medicine | Admitting: Internal Medicine

## 2013-08-13 DIAGNOSIS — E0590 Thyrotoxicosis, unspecified without thyrotoxic crisis or storm: Secondary | ICD-10-CM

## 2013-08-13 MED ORDER — SODIUM IODIDE I 131 CAPSULE
8.6000 | Freq: Once | INTRAVENOUS | Status: AC | PRN
  Administered 2013-08-13: 8.6 via ORAL

## 2013-08-14 ENCOUNTER — Encounter (HOSPITAL_COMMUNITY)
Admission: RE | Admit: 2013-08-14 | Discharge: 2013-08-14 | Disposition: A | Discharge status: Home / Self Care | Payer: BC Managed Care – PPO | Source: Ambulatory Visit | Attending: Internal Medicine | Admitting: Internal Medicine

## 2013-08-14 MED ORDER — SODIUM PERTECHNETATE TC 99M INJECTION
10.0000 | Freq: Once | INTRAVENOUS | Status: AC | PRN
  Administered 2013-08-14: 10 via INTRAVENOUS

## 2013-08-19 ENCOUNTER — Ambulatory Visit
Admission: RE | Admit: 2013-08-19 | Discharge: 2013-08-19 | Disposition: A | Discharge status: Home / Self Care | Payer: BC Managed Care – PPO | Source: Ambulatory Visit | Attending: Obstetrics and Gynecology | Admitting: Obstetrics and Gynecology

## 2013-08-19 ENCOUNTER — Encounter (INDEPENDENT_AMBULATORY_CARE_PROVIDER_SITE_OTHER): Payer: Self-pay

## 2013-08-19 DIAGNOSIS — N6001 Solitary cyst of right breast: Secondary | ICD-10-CM

## 2013-08-21 ENCOUNTER — Encounter (HOSPITAL_COMMUNITY)
Admission: RE | Admit: 2013-08-21 | Discharge: 2013-08-21 | Disposition: A | Discharge status: Home / Self Care | Payer: BC Managed Care – PPO | Source: Ambulatory Visit | Attending: Internal Medicine | Admitting: Internal Medicine

## 2013-08-21 DIAGNOSIS — E0590 Thyrotoxicosis, unspecified without thyrotoxic crisis or storm: Secondary | ICD-10-CM | POA: Diagnosis present

## 2013-08-21 LAB — HCG, SERUM, QUALITATIVE: Preg, Serum: NEGATIVE

## 2013-08-21 MED ORDER — SODIUM IODIDE I 131 CAPSULE
21.5000 | Freq: Once | INTRAVENOUS | Status: AC | PRN
  Administered 2013-08-21: 21.5 via ORAL

## 2014-01-06 ENCOUNTER — Ambulatory Visit (INDEPENDENT_AMBULATORY_CARE_PROVIDER_SITE_OTHER): Payer: Self-pay | Admitting: Surgery

## 2014-01-08 ENCOUNTER — Encounter (HOSPITAL_COMMUNITY): Payer: Self-pay | Admitting: Pharmacy Technician

## 2014-01-11 ENCOUNTER — Encounter (HOSPITAL_COMMUNITY): Payer: Self-pay

## 2014-01-11 ENCOUNTER — Encounter (HOSPITAL_COMMUNITY)
Admission: RE | Admit: 2014-01-11 | Discharge: 2014-01-11 | Disposition: A | Discharge status: Home / Self Care | Payer: BC Managed Care – PPO | Source: Ambulatory Visit | Attending: Surgery | Admitting: Surgery

## 2014-01-11 ENCOUNTER — Ambulatory Visit (HOSPITAL_COMMUNITY)
Admission: RE | Admit: 2014-01-11 | Discharge: 2014-01-11 | Disposition: A | Discharge status: Home / Self Care | Payer: BC Managed Care – PPO | Source: Ambulatory Visit | Attending: Anesthesiology | Admitting: Anesthesiology

## 2014-01-11 DIAGNOSIS — E0500 Thyrotoxicosis with diffuse goiter without thyrotoxic crisis or storm: Secondary | ICD-10-CM | POA: Insufficient documentation

## 2014-01-11 DIAGNOSIS — Z01818 Encounter for other preprocedural examination: Secondary | ICD-10-CM | POA: Insufficient documentation

## 2014-01-11 DIAGNOSIS — E0590 Thyrotoxicosis, unspecified without thyrotoxic crisis or storm: Secondary | ICD-10-CM | POA: Diagnosis not present

## 2014-01-11 DIAGNOSIS — I10 Essential (primary) hypertension: Secondary | ICD-10-CM

## 2014-01-11 HISTORY — DX: Cardiac arrhythmia, unspecified: I49.9

## 2014-01-11 HISTORY — DX: Unspecified osteoarthritis, unspecified site: M19.90

## 2014-01-11 HISTORY — DX: Anemia, unspecified: D64.9

## 2014-01-11 HISTORY — DX: Cellulitis, unspecified: L03.90

## 2014-01-11 HISTORY — DX: Thyrotoxicosis, unspecified without thyrotoxic crisis or storm: E05.90

## 2014-01-11 LAB — CBC
HCT: 36.4 % (ref 36.0–46.0)
Hemoglobin: 12.6 g/dL (ref 12.0–15.0)
MCH: 28.4 pg (ref 26.0–34.0)
MCHC: 34.6 g/dL (ref 30.0–36.0)
MCV: 82.2 fL (ref 78.0–100.0)
PLATELETS: 216 10*3/uL (ref 150–400)
RBC: 4.43 MIL/uL (ref 3.87–5.11)
RDW: 14.9 % (ref 11.5–15.5)
WBC: 4.3 10*3/uL (ref 4.0–10.5)

## 2014-01-11 LAB — BASIC METABOLIC PANEL
ANION GAP: 11 (ref 5–15)
BUN: 14 mg/dL (ref 6–23)
CHLORIDE: 103 meq/L (ref 96–112)
CO2: 27 mEq/L (ref 19–32)
CREATININE: 0.68 mg/dL (ref 0.50–1.10)
Calcium: 9.9 mg/dL (ref 8.4–10.5)
Glucose, Bld: 109 mg/dL — ABNORMAL HIGH (ref 70–99)
Potassium: 4.6 mEq/L (ref 3.7–5.3)
Sodium: 141 mEq/L (ref 137–147)

## 2014-01-11 NOTE — Patient Instructions (Addendum)
18 [name]  01/11/2014    Your procedure is scheduled on:     01/14/2014  Report to [facility] Main Entrance and follow signs to  [place] arrive at 10:00 AM.   Call this number if you have problems the morning of surgery [phone] or Presurgical Testing [phone].   Remember:  Do not eat food or drink liquids :After Midnight.      Take these medicines the morning of surgery with A SIP OF WATER:NONE                                You may not have any metal on your body including hair pins and piercings  Do not wear jewelry, make-up, lotions, powders, or deodorant.  Do not shave body hair  48 hours(2 days) of CHG soap use.               Do not bring valuables to the hospital. [place].  Contacts, dentures or bridgework may not be worn into surgery.  Leave suitcase in the car. After surgery it may be brought to your room.  For patients admitted to the hospital, checkout time is 11:00 AM the day of discharge.     ________________________________________________________________________  [facility] - Preparing for Surgery Before surgery, you can play an important role.  Because skin is not sterile, your skin needs to be as free of germs as possible.  You can reduce the number of germs on your skin by washing with CHG (chlorahexidine gluconate) soap before surgery.  CHG is an antiseptic cleaner which kills germs and bonds with the skin to continue killing germs even after washing. Please DO NOT use if you have an allergy to CHG or antibacterial soaps.  If your skin becomes reddened/irritated stop using the CHG and inform your nurse when you arrive at Short Stay. Do not shave (including legs and underarms) for at least 48 hours prior to the first CHG shower.  You may shave your face/neck. Please follow these instructions carefully:  1.  Shower with CHG Soap the night before surgery and the  morning of Surgery.  2.  If you  choose to wash your hair, wash your hair first as usual with your  normal  shampoo.  3.  After you shampoo, rinse your hair and body thoroughly to remove the  shampoo.                           4.  Use CHG as you would any other liquid soap.  You can apply chg directly  to the skin and wash                       Gently with a scrungie or clean washcloth.  5.  Apply the CHG Soap to your body ONLY FROM THE NECK DOWN.   Do not use on face/ open                           Wound or open sores. Avoid contact with eyes, ears mouth and genitals (private parts).                       Wash face,  Genitals (private parts) with your normal soap.  6.  Wash thoroughly, paying special attention to the area where your surgery  will be performed.  7.  Thoroughly rinse your body with warm water from the neck down.  8.  DO NOT shower/wash with your normal soap after using and rinsing off  the CHG Soap.                9.  Pat yourself dry with a clean towel.            10.  Wear clean pajamas.            11.  Place clean sheets on your bed the night of your first shower and do not  sleep with pets. Day of Surgery : Do not apply any lotions/deodorants the morning of surgery.  Please wear clean clothes to the hospital/surgery center.  FAILURE TO FOLLOW THESE INSTRUCTIONS MAY RESULT IN THE CANCELLATION OF YOUR SURGERY PATIENT SIGNATURE_________________________________  NURSE SIGNATURE__________________________________  ________________________________________________________________________

## 2014-01-11 NOTE — Progress Notes (Signed)
Your patient has screened at an elevated risk for Obstructive Sleep Apnea using the Stop-Bang Tool during a pre-surgical vist. A score of 4 or greater is an elevated risk. Score of 4. 

## 2014-01-12 ENCOUNTER — Inpatient Hospital Stay (HOSPITAL_COMMUNITY): Admission: RE | Admit: 2014-01-12 | Payer: BC Managed Care – PPO | Source: Ambulatory Visit

## 2014-01-13 ENCOUNTER — Inpatient Hospital Stay (HOSPITAL_COMMUNITY): Admission: RE | Admit: 2014-01-13 | Payer: BC Managed Care – PPO | Source: Ambulatory Visit

## 2014-01-14 ENCOUNTER — Observation Stay (HOSPITAL_COMMUNITY)
Admission: RE | Admit: 2014-01-14 | Discharge: 2014-01-15 | Disposition: A | Discharge status: Home / Self Care | Payer: BC Managed Care – PPO | Source: Ambulatory Visit | Attending: Surgery | Admitting: Surgery

## 2014-01-14 ENCOUNTER — Encounter (HOSPITAL_COMMUNITY)
Admission: RE | Disposition: A | Discharge status: Home / Self Care | Payer: Self-pay | Source: Ambulatory Visit | Attending: Surgery

## 2014-01-14 ENCOUNTER — Encounter (HOSPITAL_COMMUNITY): Payer: BC Managed Care – PPO | Admitting: Anesthesiology

## 2014-01-14 ENCOUNTER — Ambulatory Visit (HOSPITAL_COMMUNITY): Payer: BC Managed Care – PPO | Admitting: Anesthesiology

## 2014-01-14 ENCOUNTER — Observation Stay (HOSPITAL_COMMUNITY): Payer: BC Managed Care – PPO

## 2014-01-14 ENCOUNTER — Encounter (HOSPITAL_COMMUNITY): Payer: Self-pay | Admitting: Surgery

## 2014-01-14 DIAGNOSIS — Z91013 Allergy to seafood: Secondary | ICD-10-CM | POA: Insufficient documentation

## 2014-01-14 DIAGNOSIS — R059 Cough, unspecified: Secondary | ICD-10-CM

## 2014-01-14 DIAGNOSIS — E0500 Thyrotoxicosis with diffuse goiter without thyrotoxic crisis or storm: Secondary | ICD-10-CM | POA: Diagnosis present

## 2014-01-14 DIAGNOSIS — Z79899 Other long term (current) drug therapy: Secondary | ICD-10-CM | POA: Insufficient documentation

## 2014-01-14 DIAGNOSIS — E0590 Thyrotoxicosis, unspecified without thyrotoxic crisis or storm: Secondary | ICD-10-CM | POA: Diagnosis present

## 2014-01-14 DIAGNOSIS — R05 Cough: Secondary | ICD-10-CM

## 2014-01-14 DIAGNOSIS — Z8673 Personal history of transient ischemic attack (TIA), and cerebral infarction without residual deficits: Secondary | ICD-10-CM | POA: Diagnosis not present

## 2014-01-14 DIAGNOSIS — I10 Essential (primary) hypertension: Secondary | ICD-10-CM | POA: Insufficient documentation

## 2014-01-14 DIAGNOSIS — R0602 Shortness of breath: Secondary | ICD-10-CM

## 2014-01-14 DIAGNOSIS — D34 Benign neoplasm of thyroid gland: Secondary | ICD-10-CM | POA: Insufficient documentation

## 2014-01-14 HISTORY — PX: THYROIDECTOMY: SHX17

## 2014-01-14 SURGERY — THYROIDECTOMY
Anesthesia: General

## 2014-01-14 MED ORDER — HYDROMORPHONE HCL 1 MG/ML IJ SOLN
0.2500 mg | INTRAMUSCULAR | Status: DC | PRN
  Administered 2014-01-14 (×2): 0.25 mg via INTRAVENOUS

## 2014-01-14 MED ORDER — METOCLOPRAMIDE HCL 5 MG/ML IJ SOLN
INTRAMUSCULAR | Status: AC
  Filled 2014-01-14: qty 2

## 2014-01-14 MED ORDER — MIDAZOLAM HCL 2 MG/2ML IJ SOLN
INTRAMUSCULAR | Status: AC
  Filled 2014-01-14: qty 2

## 2014-01-14 MED ORDER — ONDANSETRON HCL 4 MG PO TABS: 4.0000 mg | ORAL_TABLET | Freq: Four times a day (QID) | ORAL | Status: DC | PRN

## 2014-01-14 MED ORDER — LABETALOL HCL 5 MG/ML IV SOLN
INTRAVENOUS | Status: AC
  Filled 2014-01-14: qty 4

## 2014-01-14 MED ORDER — ONDANSETRON HCL 4 MG/2ML IJ SOLN: 4.0000 mg | Freq: Four times a day (QID) | INTRAMUSCULAR | Status: DC | PRN

## 2014-01-14 MED ORDER — ONDANSETRON HCL 4 MG/2ML IJ SOLN
INTRAMUSCULAR | Status: DC | PRN
  Administered 2014-01-14: 4 mg via INTRAVENOUS

## 2014-01-14 MED ORDER — CEFAZOLIN SODIUM-DEXTROSE 2-3 GM-% IV SOLR
INTRAVENOUS | Status: AC
  Filled 2014-01-14: qty 50

## 2014-01-14 MED ORDER — MENTHOL 3 MG MT LOZG
1.0000 | LOZENGE | OROMUCOSAL | Status: DC | PRN
  Filled 2014-01-14: qty 9

## 2014-01-14 MED ORDER — FENTANYL CITRATE 0.05 MG/ML IJ SOLN
INTRAMUSCULAR | Status: DC | PRN
  Administered 2014-01-14: 50 ug via INTRAVENOUS
  Administered 2014-01-14: 100 ug via INTRAVENOUS
  Administered 2014-01-14 (×2): 50 ug via INTRAVENOUS

## 2014-01-14 MED ORDER — PROPRANOLOL HCL 40 MG PO TABS
40.0000 mg | ORAL_TABLET | Freq: Every day | ORAL | Status: DC
  Administered 2014-01-14: 40 mg via ORAL
  Filled 2014-01-14 (×2): qty 1

## 2014-01-14 MED ORDER — DEXAMETHASONE SODIUM PHOSPHATE 10 MG/ML IJ SOLN
INTRAMUSCULAR | Status: AC
  Filled 2014-01-14: qty 1

## 2014-01-14 MED ORDER — LACTATED RINGERS IV SOLN
INTRAVENOUS | Status: DC | PRN
  Administered 2014-01-14: 13:00:00 via INTRAVENOUS

## 2014-01-14 MED ORDER — LACTATED RINGERS IV SOLN
INTRAVENOUS | Status: DC
  Administered 2014-01-14: 1000 mL via INTRAVENOUS

## 2014-01-14 MED ORDER — FUROSEMIDE 10 MG/ML IJ SOLN
20.0000 mg | Freq: Once | INTRAMUSCULAR | Status: AC
  Administered 2014-01-14: 20 mg via INTRAVENOUS
  Filled 2014-01-14: qty 2

## 2014-01-14 MED ORDER — NEOSTIGMINE METHYLSULFATE 10 MG/10ML IV SOLN
INTRAVENOUS | Status: AC
Start: 2014-01-14 — End: 2014-01-14
  Filled 2014-01-14: qty 1

## 2014-01-14 MED ORDER — LIDOCAINE HCL (CARDIAC) 20 MG/ML IV SOLN
INTRAVENOUS | Status: AC
  Filled 2014-01-14: qty 5

## 2014-01-14 MED ORDER — HYDROMORPHONE HCL 1 MG/ML IJ SOLN
INTRAMUSCULAR | Status: DC | PRN
  Administered 2014-01-14: 0.5 mg via INTRAVENOUS

## 2014-01-14 MED ORDER — CALCIUM CARBONATE 1250 (500 CA) MG PO TABS
2.0000 | ORAL_TABLET | Freq: Three times a day (TID) | ORAL | Status: DC
  Administered 2014-01-15 (×2): 1000 mg via ORAL
  Filled 2014-01-14 (×4): qty 2

## 2014-01-14 MED ORDER — 0.9 % SODIUM CHLORIDE (POUR BTL) OPTIME
TOPICAL | Status: DC | PRN
  Administered 2014-01-14: 1000 mL

## 2014-01-14 MED ORDER — ACETAMINOPHEN 10 MG/ML IV SOLN
1000.0000 mg | Freq: Once | INTRAVENOUS | Status: AC
  Administered 2014-01-14: 1000 mg via INTRAVENOUS
  Filled 2014-01-14: qty 100

## 2014-01-14 MED ORDER — GLYCOPYRROLATE 0.2 MG/ML IJ SOLN
INTRAMUSCULAR | Status: AC
  Filled 2014-01-14: qty 3

## 2014-01-14 MED ORDER — CEFAZOLIN SODIUM-DEXTROSE 2-3 GM-% IV SOLR: 2.0000 g | INTRAVENOUS | Status: DC

## 2014-01-14 MED ORDER — CISATRACURIUM BESYLATE 20 MG/10ML IV SOLN
INTRAVENOUS | Status: AC
  Filled 2014-01-14: qty 10

## 2014-01-14 MED ORDER — KCL IN DEXTROSE-NACL 30-5-0.45 MEQ/L-%-% IV SOLN
INTRAVENOUS | Status: DC
  Administered 2014-01-14: 22:00:00 via INTRAVENOUS
  Filled 2014-01-14 (×2): qty 1000

## 2014-01-14 MED ORDER — LABETALOL HCL 5 MG/ML IV SOLN
INTRAVENOUS | Status: DC | PRN
  Administered 2014-01-14 (×2): 2.5 mg via INTRAVENOUS

## 2014-01-14 MED ORDER — IPRATROPIUM-ALBUTEROL 0.5-2.5 (3) MG/3ML IN SOLN
3.0000 mL | RESPIRATORY_TRACT | Status: DC
  Administered 2014-01-14: 3 mL via RESPIRATORY_TRACT
  Filled 2014-01-14 (×9): qty 3

## 2014-01-14 MED ORDER — PROMETHAZINE HCL 25 MG/ML IJ SOLN
INTRAMUSCULAR | Status: AC
  Filled 2014-01-14: qty 1

## 2014-01-14 MED ORDER — OXYCODONE HCL 5 MG PO TABS: 5.0000 mg | ORAL_TABLET | Freq: Once | ORAL | Status: DC | PRN

## 2014-01-14 MED ORDER — GLYCOPYRROLATE 0.2 MG/ML IJ SOLN
INTRAMUSCULAR | Status: DC | PRN
  Administered 2014-01-14: .6 mg via INTRAVENOUS

## 2014-01-14 MED ORDER — CISATRACURIUM BESYLATE (PF) 10 MG/5ML IV SOLN
INTRAVENOUS | Status: DC | PRN
  Administered 2014-01-14: 2 mg via INTRAVENOUS
  Administered 2014-01-14: 6 mg via INTRAVENOUS
  Administered 2014-01-14: 2 mg via INTRAVENOUS

## 2014-01-14 MED ORDER — IPRATROPIUM-ALBUTEROL 0.5-2.5 (3) MG/3ML IN SOLN
3.0000 mL | RESPIRATORY_TRACT | Status: AC
  Administered 2014-01-14: 3 mL via RESPIRATORY_TRACT
  Filled 2014-01-14: qty 3

## 2014-01-14 MED ORDER — HYDROMORPHONE HCL 1 MG/ML IJ SOLN
INTRAMUSCULAR | Status: AC
  Filled 2014-01-14: qty 1

## 2014-01-14 MED ORDER — MEPERIDINE HCL 50 MG/ML IJ SOLN: 6.2500 mg | INTRAMUSCULAR | Status: DC | PRN

## 2014-01-14 MED ORDER — FENTANYL CITRATE 0.05 MG/ML IJ SOLN
INTRAMUSCULAR | Status: AC
  Filled 2014-01-14: qty 5

## 2014-01-14 MED ORDER — HYDROMORPHONE HCL 1 MG/ML IJ SOLN: 1.0000 mg | INTRAMUSCULAR | Status: DC | PRN

## 2014-01-14 MED ORDER — HYDROCODONE-ACETAMINOPHEN 5-325 MG PO TABS: 1.0000 | ORAL_TABLET | ORAL | Status: DC | PRN

## 2014-01-14 MED ORDER — LACTATED RINGERS IV SOLN: INTRAVENOUS | Status: DC

## 2014-01-14 MED ORDER — NEOSTIGMINE METHYLSULFATE 10 MG/10ML IV SOLN
INTRAVENOUS | Status: DC | PRN
  Administered 2014-01-14: 4 mg via INTRAVENOUS

## 2014-01-14 MED ORDER — GUAIFENESIN 100 MG/5ML PO SOLN: 5.0000 mL | ORAL | Status: DC | PRN

## 2014-01-14 MED ORDER — ONDANSETRON HCL 4 MG/2ML IJ SOLN
INTRAMUSCULAR | Status: AC
  Filled 2014-01-14: qty 2

## 2014-01-14 MED ORDER — OXYCODONE HCL 5 MG/5ML PO SOLN
5.0000 mg | Freq: Once | ORAL | Status: DC | PRN
  Filled 2014-01-14: qty 5

## 2014-01-14 MED ORDER — ALBUTEROL SULFATE (2.5 MG/3ML) 0.083% IN NEBU
INHALATION_SOLUTION | RESPIRATORY_TRACT | Status: AC
  Filled 2014-01-14: qty 3

## 2014-01-14 MED ORDER — HYDROMORPHONE HCL 2 MG/ML IJ SOLN
INTRAMUSCULAR | Status: AC
  Filled 2014-01-14: qty 1

## 2014-01-14 MED ORDER — ACETAMINOPHEN 325 MG PO TABS
650.0000 mg | ORAL_TABLET | ORAL | Status: DC | PRN
  Administered 2014-01-14 – 2014-01-15 (×2): 650 mg via ORAL
  Filled 2014-01-14 (×2): qty 2

## 2014-01-14 MED ORDER — ALBUTEROL SULFATE (2.5 MG/3ML) 0.083% IN NEBU
2.5000 mg | INHALATION_SOLUTION | Freq: Four times a day (QID) | RESPIRATORY_TRACT | Status: DC | PRN
  Administered 2014-01-14: 2.5 mg via RESPIRATORY_TRACT

## 2014-01-14 MED ORDER — MIDAZOLAM HCL 5 MG/5ML IJ SOLN
INTRAMUSCULAR | Status: DC | PRN
  Administered 2014-01-14: 2 mg via INTRAVENOUS

## 2014-01-14 MED ORDER — PROPOFOL 10 MG/ML IV BOLUS
INTRAVENOUS | Status: AC
  Filled 2014-01-14: qty 20

## 2014-01-14 MED ORDER — PROPOFOL 10 MG/ML IV BOLUS
INTRAVENOUS | Status: DC | PRN
  Administered 2014-01-14: 150 mg via INTRAVENOUS

## 2014-01-14 MED ORDER — LIDOCAINE HCL (CARDIAC) 20 MG/ML IV SOLN
INTRAVENOUS | Status: DC | PRN
  Administered 2014-01-14: 50 mg via INTRAVENOUS

## 2014-01-14 MED ORDER — PROMETHAZINE HCL 25 MG/ML IJ SOLN
6.2500 mg | INTRAMUSCULAR | Status: DC | PRN
  Administered 2014-01-14: 6.25 mg via INTRAVENOUS

## 2014-01-14 SURGICAL SUPPLY — 36 items
ATTRACTOMAT 16X20 MAGNETIC DRP (DRAPES) ×2 IMPLANT
BENZOIN TINCTURE PRP APPL 2/3 (GAUZE/BANDAGES/DRESSINGS) ×2 IMPLANT
BLADE HEX COATED 2.75 (ELECTRODE) ×2 IMPLANT
BLADE SURG 15 STRL LF DISP TIS (BLADE) ×1 IMPLANT
BLADE SURG 15 STRL SS (BLADE) ×1
CHLORAPREP W/TINT 26ML (MISCELLANEOUS) ×2 IMPLANT
CLIP TI MEDIUM 6 (CLIP) ×8 IMPLANT
CLIP TI WIDE RED SMALL 6 (CLIP) ×10 IMPLANT
DISSECTOR ROUND CHERRY 3/8 STR (MISCELLANEOUS) IMPLANT
DRAPE PED LAPAROTOMY (DRAPES) ×2 IMPLANT
DRESSING SURGICEL FIBRLLR 1X2 (HEMOSTASIS) ×1 IMPLANT
DRSG SURGICEL FIBRILLAR 1X2 (HEMOSTASIS) ×2
ELECT REM PT RETURN 9FT ADLT (ELECTROSURGICAL) ×2
ELECTRODE REM PT RTRN 9FT ADLT (ELECTROSURGICAL) ×1 IMPLANT
GAUZE SPONGE 4X4 12PLY STRL (GAUZE/BANDAGES/DRESSINGS) IMPLANT
GAUZE SPONGE 4X4 16PLY XRAY LF (GAUZE/BANDAGES/DRESSINGS) ×2 IMPLANT
GLOVE SURG ORTHO 8.0 STRL STRW (GLOVE) ×2 IMPLANT
GOWN STRL REUS W/TWL XL LVL3 (GOWN DISPOSABLE) ×4 IMPLANT
KIT BASIN OR (CUSTOM PROCEDURE TRAY) ×2 IMPLANT
MANIFOLD NEPTUNE II (INSTRUMENTS) ×2 IMPLANT
NS IRRIG 1000ML POUR BTL (IV SOLUTION) ×2 IMPLANT
PACK BASIC VI WITH GOWN DISP (CUSTOM PROCEDURE TRAY) ×2 IMPLANT
PENCIL BUTTON HOLSTER BLD 10FT (ELECTRODE) ×2 IMPLANT
SHEARS HARMONIC 9CM CVD (BLADE) ×2 IMPLANT
STAPLER VISISTAT 35W (STAPLE) IMPLANT
STRIP CLOSURE SKIN 1/2X4 (GAUZE/BANDAGES/DRESSINGS) ×2 IMPLANT
SUT MNCRL AB 4-0 PS2 18 (SUTURE) ×2 IMPLANT
SUT SILK 2 0 (SUTURE)
SUT SILK 2-0 18XBRD TIE 12 (SUTURE) IMPLANT
SUT SILK 3 0 (SUTURE)
SUT SILK 3-0 18XBRD TIE 12 (SUTURE) IMPLANT
SUT VIC AB 3-0 SH 18 (SUTURE) ×4 IMPLANT
SYR BULB IRRIGATION 50ML (SYRINGE) ×2 IMPLANT
TOWEL OR 17X26 10 PK STRL BLUE (TOWEL DISPOSABLE) ×2 IMPLANT
TOWEL OR NON WOVEN STRL DISP B (DISPOSABLE) ×2 IMPLANT
YANKAUER SUCT BULB TIP 10FT TU (MISCELLANEOUS) ×2 IMPLANT

## 2014-01-14 NOTE — H&P (Signed)
General Surgery [facility]  Corby B. Takacs 01/06/2014 10:16 AM Location: [place] [facility] Patient #: [id number] DOB: October 18, 1960 Married / Language: [name] / Race: White Female  History of Present Illness Earnstine Regal MD; 01/06/2014 10:40 AM) Patient words: eval thyroid.  The patient is a 53 year old female who presents with hyperthyroidism.  The patient is referred by Dr. Delrae Rend.  Patient presents for surgical evaluation for thyroidectomy for management of hyperthyroidism [name]) refractory to treatment with radioactive iodine.  Patient was diagnosed with Graves' disease approximately 6 years ago in 2009. She was treated with methimazole. She underwent radioactive iodine ablation in June of 2015 without success. Patient has developed subcutaneous complications. She was started on beta-blockade with improvement in her tremor. She is currently back on methimazole. She has had nuclear scans showing findings consistent with Graves' disease. She is now referred for consideration for thyroidectomy for management of Graves' disease refractory to radioactive iodine treatment.  Patient has had no prior surgery on the neck. There is a family history of hypothyroidism and 2 sisters. There is no family history of other endocrine neoplasm.  Patient notes that tremor and palpitations have improved since institution of her propranolol.   Other Problems Marjean Donna, CMA; 01/06/2014 10:18 AM) Arthritis Bladder Problems Cerebrovascular Accident Diverticulosis Hemorrhoids High blood pressure Kidney Stone Oophorectomy Bilateral. Thyroid Disease Transfusion history  Past Surgical History Marjean Donna, CMA; 01/06/2014 10:18 AM) Colon Polyp Removal - Colonoscopy Hysterectomy (not due to cancer) - Complete Knee Surgery Right. Tonsillectomy  Diagnostic Studies History Marjean Donna, CMA; 01/06/2014 10:18 AM) Colonoscopy within last  year Mammogram within last year Pap Smear 1-5 years ago  Allergies Marjean Donna, CMA; 01/06/2014 10:17 AM) Shellfish-derived Products No Known Drug Allergies10/21/2015 (Marked as Inactive)  Medication History (Sonya Bynum, CMA; 01/06/2014 10:16 AM) Lisinopril-Hydrochlorothiazide (Oral) Specific dose unknown - Active. Atorvastatin Calcium (Oral) Specific dose unknown - Active. Clopidogrel Bisulfate (Oral) Specific dose unknown - Active. Methimazole (Oral) Specific dose unknown - Active.  Social History Marjean Donna, CMA; 01/06/2014 10:18 AM) Caffeine use Carbonated beverages. No alcohol use No drug use Tobacco use Never smoker.  Family History Marjean Donna, [place]; 01/06/2014 10:18 AM) Alcohol Abuse Father. Arthritis Brother, Mother, Sister. Breast Cancer Sister. Cerebrovascular Accident Brother. Colon Polyps Father, Mother, Sister. Diabetes Mellitus Father. Hypertension Father, Mother. Kidney Disease Mother. Melanoma Sister. Respiratory Condition Father. Thyroid problems Sister.  Pregnancy / Birth History Marjean Donna, [place]; 01/06/2014 10:18 AM) Age at menarche 46 years. Age of menopause 51-55 Contraceptive History Oral contraceptives. Gravida 4 Maternal age 21-25 Para 3  Review of Systems ([place]; 01/06/2014 10:18 AM) General Present- Fatigue and Weight Loss. Not Present- Appetite Loss, Chills, Fever, Night Sweats and Weight Gain. Skin Not Present- Change in Wart/Mole, Dryness, Hives, Jaundice, New Lesions, Non-Healing Wounds, Rash and Ulcer. HEENT Present- Wears glasses/contact lenses. Not Present- Earache, Hearing Loss, Hoarseness, Nose Bleed, Oral Ulcers, Ringing in the Ears, Seasonal Allergies, Sinus Pain, Sore Throat, Visual Disturbances and Yellow Eyes. Respiratory Present- Snoring. Not Present- Bloody sputum, Chronic Cough, Difficulty Breathing and Wheezing. Breast Not Present- Breast Mass, Breast Pain, Nipple Discharge and Skin  Changes. Cardiovascular Present- Palpitations and Shortness of Breath. Not Present- Chest Pain, Difficulty Breathing Lying Down, Leg Cramps, Rapid Heart Rate and Swelling of Extremities. Gastrointestinal Present- Change in Bowel Habits and Hemorrhoids. Not Present- Abdominal Pain, Bloating, Bloody Stool, Chronic diarrhea, Constipation, Difficulty Swallowing, Excessive gas, Gets full quickly at meals, Indigestion, Nausea, Rectal Pain and Vomiting. Female  Genitourinary Present- Nocturia and Urgency. Not Present- Frequency, Painful Urination and Pelvic Pain. Musculoskeletal Present- Joint Pain and Joint Stiffness. Not Present- Back Pain, Muscle Pain, Muscle Weakness and Swelling of Extremities. Neurological Present- Tremor. Not Present- Decreased Memory, Fainting, Headaches, Numbness, Seizures, Tingling, Trouble walking and Weakness. Psychiatric Not Present- Anxiety, Bipolar, Change in Sleep Pattern, Depression, Fearful and Frequent crying. Endocrine Present- Hair Changes. Not Present- Cold Intolerance, Excessive Hunger, Heat Intolerance, Hot flashes and New Diabetes. Hematology Present- Easy Bruising. Not Present- Excessive bleeding, Gland problems, HIV and Persistent Infections.   Vitals (Sonya Bynum CMA; 01/06/2014 10:18 AM) 01/06/2014 10:17 AM Weight: 220 lb Height: 64in Body Surface Area: 2.12 m Body Mass Index: 37.76 kg/m Temp.: 98.64F(Temporal)  Pulse: 72 (Regular)  BP: 126/76 (Sitting, Left Arm, Standard)    Physical Exam Earnstine Regal MD; 01/06/2014 10:41 AM) The physical exam findings are as follows: Note:General - appears comfortable, no distress; not diaphorectic  HEENT - normocephalic; sclerae clear, gaze conjugate; mucous membranes moist, dentition good; voice normal  Neck - symmetric on extension; no palpable anterior or posterior cervical adenopathy; Palpation shows a thyroid gland which is mildly firm without discrete or dominant mass; size is at the upper  limit of normal; no tenderness  Chest - clear bilaterally with rhonchi, rales, or wheeze  Cor - regular rhythm with normal rate; no significant murmur  Ext - non-tender without significant edema or lymphedema  Neuro - grossly intact; no tremor    Assessment & Plan Earnstine Regal MD; 01/06/2014 10:43 AM) [name] (242.00  E05.00) Current Plans  Instructed to keep follow-up appointment as scheduled  The patient and I reviewed the above information and studies together. I provided her with written literature regarding thyroid surgery to review at home.  Patient has Graves' disease (hyperthyroidism) refractory to medical management. She would like to proceed with surgery. I recommended total thyroidectomy.  The patient and I discussed total thyroidectomy at length. We reviewed the procedure, the hospital stay, and her postoperative recovery. We discussed potential complications in detail including recurrent laryngeal nerve injury and injury to parathyroid glands. We discussed the need for lifelong thyroid hormone replacement following surgery. She understands and would like to proceed in the near future.  The risks and benefits of the procedure have been discussed at length with the patient. The patient understands the proposed procedure, potential alternative treatments, and the course of recovery to be expected. All of the patient's questions have been answered at this time. The patient wishes to proceed with surgery.  Earnstine Regal, MD, [facility] Office: [phone]

## 2014-01-14 NOTE — Progress Notes (Signed)
Just rounded on pt and gave a dose of pain medicine pt  Is now calm and resting. O2 sat is 96. Will continue to monitor.

## 2014-01-14 NOTE — Progress Notes (Signed)
Quick Note:  These results are acceptable for scheduled surgery.  Eston Heslin M. Lacreasha Hinds, MD, FACS [facility] Office: [phone]   ______ 

## 2014-01-14 NOTE — Progress Notes (Signed)
Quick Note:  EKG is acceptable for scheduled surgery.  [name] M. Emmalea Treanor, MD, FACS [facility] Office: [phone]   ______ 

## 2014-01-14 NOTE — Progress Notes (Signed)
Received report from day shift nurse that when pt was brought to floor she presented with ronchi upon assessment and had a "rattling" in her cough. Additionally pt's O2 sat was staying in the low 80s while the pt was on 4L. Respiratory was called to assess. After being seen by respiratory pts O2 sat came up to the mid 90s. Pt did have some coughing but stated she felt better after receiving the treatment. Additionally I paged the MD on call. New orders received. Will continue to monitor pt.

## 2014-01-14 NOTE — Progress Notes (Signed)
Quick Note:  Pre-operative chest x-ray is acceptable for scheduled surgery.  Tallie Dodds M. Deriona Altemose, MD, FACS [facility] Office: [phone]   ______ 

## 2014-01-14 NOTE — Op Note (Signed)
[name], [name] NO.:  [id number]  MEDICAL RECORD NO.:  [id number]  LOCATION:  68                         FACILITY:  [facility]  PHYSICIAN:  Earnstine Regal, MD      DATE OF BIRTH:  04/21/1960  DATE OF PROCEDURE:  01/14/2014                               OPERATIVE REPORT   PREOPERATIVE DIAGNOSIS:  Graves' disease (hyperthyroidism).  POSTOPERATIVE DIAGNOSIS:  Graves' disease (hyperthyroidism).  PROCEDURE:  Total thyroidectomy.  SURGEON:  Armandina Gemma, MD, FACS  ANESTHESIA:  General.  ESTIMATED BLOOD LOSS:  Minimal.  PREPARATION:  ChloraPrep.  COMPLICATIONS:  None.  INDICATIONS:  The patient is a 53 year old female referred by Dr. Delrae Rend for evaluation for total thyroidectomy for management of hyperthyroidism Mary Primas' disease) refractory to treatment with radioactive iodine.  The patient had been originally diagnosed 6 years ago in 2009.  She was treated with methimazole.  She underwent radioactive iodine ablation in June 2015 without success. The patient was started on beta blockade for tremor and was placed back on methimazole.  She now comes to Surgery for thyroidectomy.  BODY OF REPORT:  Procedure was done in OR #12 at the [facility].  The patient was brought to the operating room, placed in supine position on the operating room table.  Following administration of general anesthesia, the patient was positioned and then prepped and draped in the usual aseptic fashion.  After ascertaining that an adequate level of anesthesia had been achieved, a Kocher incision was made with a #15 blade.  Dissection was carried through subcutaneous tissues and platysma.  Hemostasis was achieved with electrocautery.  Skin flaps were elevated cephalad and caudad from the thyroid notch to the sternal notch.  A Mahorner self-retaining retractor was placed for exposure.  Strap muscles were incised in the midline and dissection was begun on the left  side.  Left thyroid lobe was quite firm.  There were no dominant nor discrete masses.  Lobe was gently mobilized with blunt dissection.  Venous tributaries were divided between Ligaclips with the Harmonic scalpel.  Superior pole vessels were dissected out and divided between small and medium Ligaclips with the Harmonic scalpel.  Superior parathyroid gland was identified and preserved.  Inferior venous tributaries were divided between Ligaclips. Gland was rolled anteriorly.  Branches of the inferior thyroid artery were divided between small Ligaclips with the Harmonic scalpel. Recurrent laryngeal nerve was identified and preserved.  Ligament of Gwenlyn Found was released with the electrocautery and the gland was mobilized onto the anterior trachea.  Isthmus was mobilized across the midline. There was a moderate-sized pyramidal lobe overlying the thyroid cartilage which was dissected out and resected with the isthmus.  Dry pack was placed in the left neck.  Next, we turned our attention to the right thyroid lobe.  Right thyroid lobe was mildly enlarged.  Again, it was quite firm without discrete or dominant nodule.  Gland was gently mobilized with blunt dissection. Venous tributaries were divided between Ligaclips with the Harmonic scalpel.  Superior pole was dissected out and superior pole vessels divided individually between small and medium Ligaclips with the Harmonic scalpel.  Inferior venous tributaries were divided between  Ligaclips.  Gland was rolled anteriorly.  Parathyroid tissue was identified and preserved on its vascular pedicle.  Branches of the inferior thyroid artery were divided between small Ligaclips with the Harmonic scalpel.  Recurrent laryngeal nerve was identified and preserved.  Ligament of Gwenlyn Found was released with the electrocautery and the gland was mobilized onto the anterior thyroid from which it was completely excised with the Harmonic scalpel.  Specimen was marked  with a suture at the right superior pole.  The entire thyroid gland was submitted to Pathology for review.  The neck was irrigated with warm saline and good hemostasis was achieved throughout the operative field.  Surgicel was placed throughout the operative field.  Strap muscles were reapproximated in the midline with interrupted 3-0 Vicryl sutures.  Platysma was closed with interrupted 3- 0 Vicryl sutures.  Skin was closed with a running 4-0 Monocryl subcuticular suture.  Wound was washed and dried and benzoin and Steri- Strips were applied.  Sterile dressings were applied.  The patient was awakened from anesthesia and brought to the recovery room.  The patient tolerated the procedure well.   Earnstine Regal, MD, [facility] Office: [phone]   TMG/MEDQ  D:  01/14/2014  T:  01/14/2014  Job:  [id number]  cc:   Katina Degree, M.D. Fax: [place] Willey Blade, MD Fax: [phone]

## 2014-01-14 NOTE — Interval H&P Note (Signed)
History and Physical Interval Note:  01/14/2014 1:54 PM  MAO LOCKNER  has presented today for surgery, with the diagnosis of graves disease, hyperthyroidism.  The various methods of treatment have been discussed with the patient and family. After consideration of risks, benefits and other options for treatment, the patient has consented to    Procedure(s): TOTAL THYROIDECTOMY (N/A) as a surgical intervention .    The patient's history has been reviewed, patient examined, no change in status, stable for surgery.  I have reviewed the patient's chart and labs.  Questions were answered to the patient's satisfaction.    Earnstine Regal, MD, [facility] Office: [place]

## 2014-01-14 NOTE — Transfer of Care (Signed)
Immediate Anesthesia Transfer of Care Note  Patient: [name]  Procedure(s) Performed: Procedure(s) (LRB): TOTAL THYROIDECTOMY (N/A)  Patient Location: PACU  Anesthesia Type: General  Level of Consciousness: sedated, patient cooperative and responds to stimulation  Airway & Oxygen Therapy: Patient Spontanous Breathing and Patient connected to face mask oxgen  Post-op Assessment: Report given to PACU RN and Post -op Vital signs reviewed and stable  Post vital signs: Reviewed and stable  Complications: No apparent anesthesia complications

## 2014-01-14 NOTE — Anesthesia Postprocedure Evaluation (Signed)
Anesthesia Post Note  Patient: [name]  Procedure(s) Performed: Procedure(s) (LRB): TOTAL THYROIDECTOMY (N/A)  Anesthesia type: General  Patient location: PACU  Post pain: Pain level controlled  Post assessment: Post-op Vital signs reviewed  Last Vitals: BP 112/67  Pulse 84  Temp(Src) 36.7 C (Oral)  Resp 15  Ht 5\' 4"  (1.626 m)  Wt 221 lb (100.245 kg)  BMI 37.92 kg/m2  SpO2 97%  Post vital signs: Reviewed  Level of consciousness: sedated  Complications: No apparent anesthesia complications

## 2014-01-14 NOTE — Anesthesia Preprocedure Evaluation (Signed)
Anesthesia Evaluation  Patient identified by MRN, date of birth, ID band Patient awake    Reviewed: Allergy & Precautions, H&P , NPO status , Patient's Chart, lab work & pertinent test results, reviewed documented beta blocker date and time   History of Anesthesia Complications (+) PONV and history of anesthetic complications  Airway Mallampati: II  TM Distance: >3 FB Neck ROM: Full    Dental no notable dental hx.    Pulmonary neg pulmonary ROS,  breath sounds clear to auscultation  Pulmonary exam normal       Cardiovascular hypertension, Pt. on medications and Pt. on home beta blockers + dysrhythmias Rhythm:Regular Rate:Normal     Neuro/Psych CVA negative psych ROS   GI/Hepatic negative GI ROS, Neg liver ROS,   Endo/Other  negative endocrine ROSHyperthyroidism   Renal/GU negative Renal ROS     Musculoskeletal  (+) Arthritis -,   Abdominal   Peds  Hematology negative hematology ROS (+) anemia ,   Anesthesia Other Findings   Reproductive/Obstetrics negative OB ROS                             Anesthesia Physical Anesthesia Plan  ASA: III  Anesthesia Plan: General   Post-op Pain Management:    Induction: Intravenous  Airway Management Planned: Oral ETT  Additional Equipment:   Intra-op Plan:   Post-operative Plan: Extubation in OR  Informed Consent: I have reviewed the patients History and Physical, chart, labs and discussed the procedure including the risks, benefits and alternatives for the proposed anesthesia with the patient or authorized representative who has indicated his/her understanding and acceptance.   Dental advisory given  Plan Discussed with: CRNA  Anesthesia Plan Comments:         Anesthesia Quick Evaluation

## 2014-01-15 ENCOUNTER — Other Ambulatory Visit (INDEPENDENT_AMBULATORY_CARE_PROVIDER_SITE_OTHER): Payer: Self-pay

## 2014-01-15 DIAGNOSIS — E0500 Thyrotoxicosis with diffuse goiter without thyrotoxic crisis or storm: Secondary | ICD-10-CM | POA: Diagnosis not present

## 2014-01-15 LAB — BASIC METABOLIC PANEL
Anion gap: 12 (ref 5–15)
BUN: 18 mg/dL (ref 6–23)
CALCIUM: 9.5 mg/dL (ref 8.4–10.5)
CO2: 27 mEq/L (ref 19–32)
Chloride: 101 mEq/L (ref 96–112)
Creatinine, Ser: 0.71 mg/dL (ref 0.50–1.10)
Glucose, Bld: 165 mg/dL — ABNORMAL HIGH (ref 70–99)
POTASSIUM: 5.1 meq/L (ref 3.7–5.3)
Sodium: 140 mEq/L (ref 137–147)

## 2014-01-15 MED ORDER — PROPRANOLOL HCL 40 MG PO TABS: 40.0000 mg | ORAL_TABLET | Freq: Every day | ORAL | Status: DC

## 2014-01-15 MED ORDER — CALCIUM CARBONATE-VITAMIN D 500-200 MG-UNIT PO TABS: 1.0000 | ORAL_TABLET | Freq: Two times a day (BID) | ORAL | Status: DC

## 2014-01-15 MED ORDER — IPRATROPIUM-ALBUTEROL 0.5-2.5 (3) MG/3ML IN SOLN: 3.0000 mL | RESPIRATORY_TRACT | Status: DC | PRN

## 2014-01-15 MED ORDER — IBUPROFEN 800 MG PO TABS: 800.0000 mg | ORAL_TABLET | Freq: Three times a day (TID) | ORAL | Status: DC | PRN

## 2014-01-15 NOTE — Progress Notes (Signed)
UR completed 

## 2014-01-15 NOTE — Discharge Summary (Signed)
Physician Discharge Summary [facility]  Patient ID: [name] MRN: [id number] DOB/AGE: [birth year] 53 y.o.  Admit date: 01/14/2014 Discharge date: 01/15/2014  Admission Diagnoses:  hyperthyroidism  Discharge Diagnoses:  Principal Problem:   Graves' disease Active Problems:   Hyperthyroidism   Discharged Condition: good  Hospital Course: Patient was admitted for observation following thyroid surgery.  Post op course was uncomplicated.  Transient hypoxia resolved this AM.  Pain was well controlled.  Tolerated diet.  Post op calcium level on morning following surgery was 9.5 mg/dl.  Patient was prepared for discharge home on POD#1.  Consults: None  Treatments: respiratory therapy: O2  Discharge Exam: Blood pressure 99/58, pulse 87, temperature 98.4 F (36.9 C), temperature source Oral, resp. rate 18, height 5\' 4"  (1.626 m), weight 221 lb (100.245 kg), SpO2 97.00%. HEENT - clear Neck - wound clear and dry and intact; mild ecchymosis; voice moderately hoarse this AM Chest - clear bilaterally Cor - RRR  Disposition: Home  Discharge Instructions   Diet - low sodium heart healthy    Complete by:  As directed      Discharge instructions    Complete by:  As directed   THYROID & PARATHYROID SURGERY - POST OP INSTRUCTIONS  Always review your discharge instruction sheet from the facility where your surgery was performed.  A prescription for pain medication may be given to you upon discharge.  Take your pain medication as prescribed.  If narcotic pain medicine is not needed, then you may take acetaminophen (Tylenol) or ibuprofen (Advil) as needed.  Take your usually prescribed medications unless otherwise directed.  If you need a refill on your pain medication, please contact your pharmacy. They will contact our office to request authorization.  Prescriptions will not be processed after 5 pm or on weekends.  Start with a light diet upon arrival home, such  as soup and crackers or toast.  Be sure to drink plenty of fluids daily.  Resume your normal diet the day after surgery.  Most patients will experience some swelling and bruising on the chest and neck area.  Ice packs will help.  Swelling and bruising can take several days to resolve.   It is common to experience some constipation if taking pain medication after surgery.  Increasing fluid intake and taking a stool softener will usually help or prevent this problem.  A mild laxative (Milk of Magnesia or Miralax) should be taken according to package directions if there are no bowel movements after 48 hours.  You may remove your bandages 24-48 hours after surgery, and you may shower at that time.  You have steri-strips (small skin tapes) in place directly over the incision.  These strips should be left on the skin for 7-10 days and then removed.  You may resume regular (light) daily activities beginning the next day-such as daily self-care, walking, climbing stairs-gradually increasing activities as tolerated.  You may have sexual intercourse when it is comfortable.  Refrain from any heavy lifting or straining until approved by your doctor.  You may drive when you no longer are taking prescription pain medication, you can comfortably wear a seatbelt, and you can safely maneuver your car and apply brakes.  You should see your doctor in the office for a follow-up appointment approximately two to three weeks after your surgery.  Make sure that you call for this appointment within a day or two after you arrive home to insure a convenient appointment time.  WHEN TO  CALL YOUR DOCTOR: -- Fever greater than 101.5 -- Inability to urinate -- Nausea and/or vomiting - persistent -- Extreme swelling or bruising -- Continued bleeding from incision -- Increased pain, redness, or drainage from the incision -- Difficulty swallowing or breathing -- Muscle cramping or spasms -- Numbness or tingling in hands or around  lips  The clinic staff is available to answer your questions during regular business hours.  Please don't hesitate to call and ask to speak to one of the nurses if you have concerns.  Earnstine Regal, MD, [facility] Office: [phone]     Increase activity slowly    Complete by:  As directed      Remove dressing in 24 hours    Complete by:  As directed             Medication List    TAKE these medications       atorvastatin 10 MG tablet  Commonly known as:  LIPITOR  Take 10 mg by mouth daily at 12 noon.     calcium-vitamin D 500-200 MG-UNIT per tablet  Commonly known as:  OSCAL WITH D  Take 1 tablet by mouth 2 (two) times daily.     clopidogrel 75 MG tablet  Commonly known as:  PLAVIX  Take 75 mg by mouth daily at 12 noon.     ibuprofen 800 MG tablet  Commonly known as:  ADVIL,MOTRIN  Take 1 tablet (800 mg total) by mouth every 8 (eight) hours as needed.     ibuprofen 800 MG tablet  Commonly known as:  ADVIL,MOTRIN  Take 800 mg by mouth 3 (three) times daily as needed for moderate pain.     lisinopril-hydrochlorothiazide 20-12.5 MG per tablet  Commonly known as:  PRINZIDE,ZESTORETIC  Take 1 tablet by mouth daily at 12 noon.     PRESCRIPTION MEDICATION  Inject 1 each into the skin. 3 shot series of shots in right knee. One shot weekly.     propranolol 80 MG tablet  Commonly known as:  INDERAL  Take 80 mg by mouth at bedtime.     Vitamin D-3 1000 UNITS Caps  Take 1 capsule by mouth daily at 12 noon.      ASK your doctor about these medications       methimazole 10 MG tablet  Commonly known as:  TAPAZOLE  Take 60 mg by mouth daily.     POTASSIUM IODIDE PO  Take 5 drops by mouth. In juice..Starting five days prior to procedure.         Earnstine Regal, MD, [facility] Office: [phone]   Signed: Earnstine Regal 01/15/2014, 1:03 PM

## 2014-01-18 ENCOUNTER — Encounter (HOSPITAL_COMMUNITY): Payer: Self-pay | Admitting: Surgery

## 2014-01-19 ENCOUNTER — Telehealth (INDEPENDENT_AMBULATORY_CARE_PROVIDER_SITE_OTHER): Payer: Self-pay

## 2014-01-19 NOTE — Telephone Encounter (Signed)
Pt notified of path result per Dr Gala Lewandowsky request.

## 2014-01-19 NOTE — Telephone Encounter (Signed)
-----   Message from Armandina Gemma, MD sent at 01/19/2014  8:26 AM EST ----- Please contact patient and notify of benign pathology results.  Earnstine Regal, MD, [facility] Office: [phone]

## 2014-01-19 NOTE — Progress Notes (Signed)
Quick Note:  Please contact patient and notify of benign pathology results.  [name] M. Roma Bondar, MD, FACS [facility] Office: [phone]   ______ 

## 2014-01-29 LAB — CALCIUM: Calcium: 9.9 mg/dL (ref 8.7–10.2)

## 2014-09-29 ENCOUNTER — Other Ambulatory Visit: Payer: Self-pay

## 2014-09-29 ENCOUNTER — Other Ambulatory Visit: Payer: Self-pay | Admitting: Pediatrics

## 2014-09-29 DIAGNOSIS — Z1231 Encounter for screening mammogram for malignant neoplasm of breast: Secondary | ICD-10-CM

## 2014-11-02 ENCOUNTER — Ambulatory Visit
Admission: RE | Admit: 2014-11-02 | Discharge: 2014-11-02 | Disposition: A | Discharge status: Home / Self Care | Payer: BLUE CROSS/BLUE SHIELD | Source: Ambulatory Visit

## 2014-11-02 DIAGNOSIS — Z1231 Encounter for screening mammogram for malignant neoplasm of breast: Secondary | ICD-10-CM

## 2014-11-08 ENCOUNTER — Other Ambulatory Visit: Payer: Self-pay | Admitting: Obstetrics and Gynecology

## 2014-11-08 DIAGNOSIS — R928 Other abnormal and inconclusive findings on diagnostic imaging of breast: Secondary | ICD-10-CM

## 2014-11-09 ENCOUNTER — Other Ambulatory Visit: Payer: Self-pay

## 2014-11-09 ENCOUNTER — Other Ambulatory Visit: Payer: Self-pay | Admitting: Obstetrics and Gynecology

## 2014-11-09 DIAGNOSIS — R928 Other abnormal and inconclusive findings on diagnostic imaging of breast: Secondary | ICD-10-CM

## 2014-11-11 ENCOUNTER — Ambulatory Visit
Admission: RE | Admit: 2014-11-11 | Discharge: 2014-11-11 | Disposition: A | Discharge status: Home / Self Care | Payer: BLUE CROSS/BLUE SHIELD | Source: Ambulatory Visit | Attending: Obstetrics and Gynecology | Admitting: Obstetrics and Gynecology

## 2014-11-11 DIAGNOSIS — R928 Other abnormal and inconclusive findings on diagnostic imaging of breast: Secondary | ICD-10-CM

## 2015-09-19 DIAGNOSIS — N200 Calculus of kidney: Secondary | ICD-10-CM | POA: Diagnosis not present

## 2015-09-19 DIAGNOSIS — R310 Gross hematuria: Secondary | ICD-10-CM | POA: Diagnosis not present

## 2015-09-19 DIAGNOSIS — B9620 Unspecified Escherichia coli [E. coli] as the cause of diseases classified elsewhere: Secondary | ICD-10-CM | POA: Diagnosis not present

## 2015-09-19 DIAGNOSIS — N3000 Acute cystitis without hematuria: Secondary | ICD-10-CM | POA: Diagnosis not present

## 2015-09-19 DIAGNOSIS — N390 Urinary tract infection, site not specified: Secondary | ICD-10-CM | POA: Diagnosis not present

## 2015-09-23 DIAGNOSIS — E890 Postprocedural hypothyroidism: Secondary | ICD-10-CM | POA: Diagnosis not present

## 2015-09-27 DIAGNOSIS — E0500 Thyrotoxicosis with diffuse goiter without thyrotoxic crisis or storm: Secondary | ICD-10-CM | POA: Diagnosis not present

## 2015-09-27 DIAGNOSIS — R0602 Shortness of breath: Secondary | ICD-10-CM | POA: Diagnosis not present

## 2015-09-27 DIAGNOSIS — E890 Postprocedural hypothyroidism: Secondary | ICD-10-CM | POA: Diagnosis not present

## 2015-09-27 DIAGNOSIS — R490 Dysphonia: Secondary | ICD-10-CM | POA: Diagnosis not present

## 2015-10-07 ENCOUNTER — Other Ambulatory Visit: Payer: Self-pay | Admitting: Obstetrics and Gynecology

## 2015-10-07 ENCOUNTER — Other Ambulatory Visit: Payer: Self-pay | Admitting: Internal Medicine

## 2015-10-07 DIAGNOSIS — Z1231 Encounter for screening mammogram for malignant neoplasm of breast: Secondary | ICD-10-CM

## 2015-11-04 ENCOUNTER — Ambulatory Visit
Admission: RE | Admit: 2015-11-04 | Discharge: 2015-11-04 | Disposition: A | Discharge status: Home / Self Care | Payer: BLUE CROSS/BLUE SHIELD | Source: Ambulatory Visit | Attending: Internal Medicine | Admitting: Internal Medicine

## 2015-11-04 DIAGNOSIS — Z1231 Encounter for screening mammogram for malignant neoplasm of breast: Secondary | ICD-10-CM

## 2016-02-20 DIAGNOSIS — Z23 Encounter for immunization: Secondary | ICD-10-CM | POA: Diagnosis not present

## 2016-03-14 DIAGNOSIS — R05 Cough: Secondary | ICD-10-CM | POA: Diagnosis not present

## 2016-04-13 DIAGNOSIS — E785 Hyperlipidemia, unspecified: Secondary | ICD-10-CM | POA: Diagnosis not present

## 2016-04-13 DIAGNOSIS — G459 Transient cerebral ischemic attack, unspecified: Secondary | ICD-10-CM | POA: Diagnosis not present

## 2016-04-13 DIAGNOSIS — Z79899 Other long term (current) drug therapy: Secondary | ICD-10-CM | POA: Diagnosis not present

## 2016-04-13 DIAGNOSIS — I10 Essential (primary) hypertension: Secondary | ICD-10-CM | POA: Diagnosis not present

## 2016-04-19 DIAGNOSIS — E785 Hyperlipidemia, unspecified: Secondary | ICD-10-CM | POA: Diagnosis not present

## 2016-04-19 DIAGNOSIS — I10 Essential (primary) hypertension: Secondary | ICD-10-CM | POA: Diagnosis not present

## 2016-04-19 DIAGNOSIS — Z6841 Body Mass Index (BMI) 40.0 and over, adult: Secondary | ICD-10-CM | POA: Diagnosis not present

## 2016-04-19 DIAGNOSIS — Z8673 Personal history of transient ischemic attack (TIA), and cerebral infarction without residual deficits: Secondary | ICD-10-CM | POA: Diagnosis not present

## 2016-08-10 DIAGNOSIS — E039 Hypothyroidism, unspecified: Secondary | ICD-10-CM | POA: Diagnosis not present

## 2016-08-10 DIAGNOSIS — Z79899 Other long term (current) drug therapy: Secondary | ICD-10-CM | POA: Diagnosis not present

## 2016-08-17 DIAGNOSIS — I10 Essential (primary) hypertension: Secondary | ICD-10-CM | POA: Diagnosis not present

## 2016-08-17 DIAGNOSIS — E039 Hypothyroidism, unspecified: Secondary | ICD-10-CM | POA: Diagnosis not present

## 2016-09-28 ENCOUNTER — Other Ambulatory Visit: Payer: Self-pay | Admitting: Internal Medicine

## 2016-09-28 ENCOUNTER — Other Ambulatory Visit: Payer: Self-pay | Admitting: Obstetrics and Gynecology

## 2016-09-28 DIAGNOSIS — Z1231 Encounter for screening mammogram for malignant neoplasm of breast: Secondary | ICD-10-CM

## 2016-11-09 ENCOUNTER — Ambulatory Visit
Admission: RE | Admit: 2016-11-09 | Discharge: 2016-11-09 | Disposition: A | Discharge status: Home / Self Care | Payer: BLUE CROSS/BLUE SHIELD | Source: Ambulatory Visit | Attending: Internal Medicine | Admitting: Internal Medicine

## 2016-11-09 DIAGNOSIS — Z1231 Encounter for screening mammogram for malignant neoplasm of breast: Secondary | ICD-10-CM | POA: Diagnosis not present

## 2017-01-11 DIAGNOSIS — E0500 Thyrotoxicosis with diffuse goiter without thyrotoxic crisis or storm: Secondary | ICD-10-CM | POA: Diagnosis not present

## 2017-01-11 DIAGNOSIS — Z23 Encounter for immunization: Secondary | ICD-10-CM | POA: Diagnosis not present

## 2017-01-11 DIAGNOSIS — I10 Essential (primary) hypertension: Secondary | ICD-10-CM | POA: Diagnosis not present

## 2017-01-28 ENCOUNTER — Ambulatory Visit (INDEPENDENT_AMBULATORY_CARE_PROVIDER_SITE_OTHER): Payer: BLUE CROSS/BLUE SHIELD | Admitting: Otolaryngology

## 2017-01-28 DIAGNOSIS — J382 Nodules of vocal cords: Secondary | ICD-10-CM | POA: Diagnosis not present

## 2017-01-28 DIAGNOSIS — R490 Dysphonia: Secondary | ICD-10-CM

## 2017-04-19 DIAGNOSIS — Z79899 Other long term (current) drug therapy: Secondary | ICD-10-CM | POA: Diagnosis not present

## 2017-04-19 DIAGNOSIS — E0500 Thyrotoxicosis with diffuse goiter without thyrotoxic crisis or storm: Secondary | ICD-10-CM | POA: Diagnosis not present

## 2017-04-19 DIAGNOSIS — I10 Essential (primary) hypertension: Secondary | ICD-10-CM | POA: Diagnosis not present

## 2017-04-19 DIAGNOSIS — E785 Hyperlipidemia, unspecified: Secondary | ICD-10-CM | POA: Diagnosis not present

## 2017-04-26 DIAGNOSIS — M25561 Pain in right knee: Secondary | ICD-10-CM | POA: Diagnosis not present

## 2017-04-26 DIAGNOSIS — I10 Essential (primary) hypertension: Secondary | ICD-10-CM | POA: Diagnosis not present

## 2017-04-26 DIAGNOSIS — Z6841 Body Mass Index (BMI) 40.0 and over, adult: Secondary | ICD-10-CM | POA: Diagnosis not present

## 2017-04-26 DIAGNOSIS — E039 Hypothyroidism, unspecified: Secondary | ICD-10-CM | POA: Diagnosis not present

## 2017-05-02 DIAGNOSIS — Z1389 Encounter for screening for other disorder: Secondary | ICD-10-CM | POA: Diagnosis not present

## 2017-05-02 DIAGNOSIS — Z6841 Body Mass Index (BMI) 40.0 and over, adult: Secondary | ICD-10-CM | POA: Diagnosis not present

## 2017-05-02 DIAGNOSIS — Z01419 Encounter for gynecological examination (general) (routine) without abnormal findings: Secondary | ICD-10-CM | POA: Diagnosis not present

## 2017-05-02 DIAGNOSIS — Z130 Encounter for screening for diseases of the blood and blood-forming organs and certain disorders involving the immune mechanism: Secondary | ICD-10-CM | POA: Diagnosis not present

## 2017-05-20 DIAGNOSIS — M25561 Pain in right knee: Secondary | ICD-10-CM | POA: Diagnosis not present

## 2017-05-20 DIAGNOSIS — M1711 Unilateral primary osteoarthritis, right knee: Secondary | ICD-10-CM | POA: Diagnosis not present

## 2017-05-20 DIAGNOSIS — Z9889 Other specified postprocedural states: Secondary | ICD-10-CM | POA: Diagnosis not present

## 2017-07-03 DIAGNOSIS — M25561 Pain in right knee: Secondary | ICD-10-CM | POA: Diagnosis not present

## 2017-07-03 DIAGNOSIS — M1711 Unilateral primary osteoarthritis, right knee: Secondary | ICD-10-CM | POA: Diagnosis not present

## 2017-08-16 DIAGNOSIS — M13861 Other specified arthritis, right knee: Secondary | ICD-10-CM | POA: Diagnosis not present

## 2017-09-23 DIAGNOSIS — H9201 Otalgia, right ear: Secondary | ICD-10-CM | POA: Diagnosis not present

## 2017-10-08 ENCOUNTER — Other Ambulatory Visit: Payer: Self-pay | Admitting: Internal Medicine

## 2017-10-08 DIAGNOSIS — Z1231 Encounter for screening mammogram for malignant neoplasm of breast: Secondary | ICD-10-CM

## 2017-11-08 DIAGNOSIS — I10 Essential (primary) hypertension: Secondary | ICD-10-CM | POA: Diagnosis not present

## 2017-11-08 DIAGNOSIS — F329 Major depressive disorder, single episode, unspecified: Secondary | ICD-10-CM | POA: Diagnosis not present

## 2017-11-15 ENCOUNTER — Ambulatory Visit
Admission: RE | Admit: 2017-11-15 | Discharge: 2017-11-15 | Disposition: A | Discharge status: Home / Self Care | Payer: BLUE CROSS/BLUE SHIELD | Source: Ambulatory Visit | Attending: Internal Medicine | Admitting: Internal Medicine

## 2017-11-15 DIAGNOSIS — Z1231 Encounter for screening mammogram for malignant neoplasm of breast: Secondary | ICD-10-CM | POA: Diagnosis not present

## 2017-11-26 DIAGNOSIS — M1711 Unilateral primary osteoarthritis, right knee: Secondary | ICD-10-CM | POA: Diagnosis not present

## 2017-12-06 DIAGNOSIS — R1310 Dysphagia, unspecified: Secondary | ICD-10-CM | POA: Diagnosis not present

## 2017-12-06 DIAGNOSIS — Z23 Encounter for immunization: Secondary | ICD-10-CM | POA: Diagnosis not present

## 2017-12-06 DIAGNOSIS — F3340 Major depressive disorder, recurrent, in remission, unspecified: Secondary | ICD-10-CM | POA: Diagnosis not present

## 2017-12-18 DIAGNOSIS — R1312 Dysphagia, oropharyngeal phase: Secondary | ICD-10-CM | POA: Diagnosis not present

## 2017-12-18 DIAGNOSIS — J385 Laryngeal spasm: Secondary | ICD-10-CM | POA: Diagnosis not present

## 2018-02-28 DIAGNOSIS — M1711 Unilateral primary osteoarthritis, right knee: Secondary | ICD-10-CM | POA: Diagnosis not present

## 2018-03-21 DIAGNOSIS — F3340 Major depressive disorder, recurrent, in remission, unspecified: Secondary | ICD-10-CM | POA: Diagnosis not present

## 2018-03-21 DIAGNOSIS — R1310 Dysphagia, unspecified: Secondary | ICD-10-CM | POA: Diagnosis not present

## 2018-06-26 DIAGNOSIS — G459 Transient cerebral ischemic attack, unspecified: Secondary | ICD-10-CM | POA: Diagnosis not present

## 2018-06-26 DIAGNOSIS — F329 Major depressive disorder, single episode, unspecified: Secondary | ICD-10-CM | POA: Diagnosis not present

## 2018-06-26 DIAGNOSIS — I10 Essential (primary) hypertension: Secondary | ICD-10-CM | POA: Diagnosis not present

## 2018-06-26 DIAGNOSIS — Z79899 Other long term (current) drug therapy: Secondary | ICD-10-CM | POA: Diagnosis not present

## 2018-06-26 DIAGNOSIS — E785 Hyperlipidemia, unspecified: Secondary | ICD-10-CM | POA: Diagnosis not present

## 2018-07-03 DIAGNOSIS — E039 Hypothyroidism, unspecified: Secondary | ICD-10-CM | POA: Diagnosis not present

## 2018-07-03 DIAGNOSIS — R7301 Impaired fasting glucose: Secondary | ICD-10-CM | POA: Diagnosis not present

## 2018-09-24 DIAGNOSIS — E039 Hypothyroidism, unspecified: Secondary | ICD-10-CM | POA: Diagnosis not present

## 2018-09-24 DIAGNOSIS — Z79899 Other long term (current) drug therapy: Secondary | ICD-10-CM | POA: Diagnosis not present

## 2018-10-01 DIAGNOSIS — F3340 Major depressive disorder, recurrent, in remission, unspecified: Secondary | ICD-10-CM | POA: Diagnosis not present

## 2018-10-01 DIAGNOSIS — E039 Hypothyroidism, unspecified: Secondary | ICD-10-CM | POA: Diagnosis not present

## 2018-11-05 ENCOUNTER — Other Ambulatory Visit: Payer: Self-pay | Admitting: Internal Medicine

## 2018-11-05 ENCOUNTER — Other Ambulatory Visit: Payer: Self-pay

## 2018-11-05 DIAGNOSIS — Z01419 Encounter for gynecological examination (general) (routine) without abnormal findings: Secondary | ICD-10-CM | POA: Diagnosis not present

## 2018-11-05 DIAGNOSIS — Z6841 Body Mass Index (BMI) 40.0 and over, adult: Secondary | ICD-10-CM | POA: Diagnosis not present

## 2018-11-05 DIAGNOSIS — Z1231 Encounter for screening mammogram for malignant neoplasm of breast: Secondary | ICD-10-CM

## 2018-11-05 DIAGNOSIS — Z1389 Encounter for screening for other disorder: Secondary | ICD-10-CM | POA: Diagnosis not present

## 2018-12-18 ENCOUNTER — Ambulatory Visit
Admission: RE | Admit: 2018-12-18 | Discharge: 2018-12-18 | Disposition: A | Discharge status: Home / Self Care | Payer: BLUE CROSS/BLUE SHIELD | Source: Ambulatory Visit | Attending: Internal Medicine | Admitting: Internal Medicine

## 2018-12-18 ENCOUNTER — Other Ambulatory Visit: Payer: Self-pay

## 2018-12-18 DIAGNOSIS — Z1231 Encounter for screening mammogram for malignant neoplasm of breast: Secondary | ICD-10-CM | POA: Diagnosis not present

## 2018-12-30 DIAGNOSIS — Z79899 Other long term (current) drug therapy: Secondary | ICD-10-CM | POA: Diagnosis not present

## 2018-12-30 DIAGNOSIS — E039 Hypothyroidism, unspecified: Secondary | ICD-10-CM | POA: Diagnosis not present

## 2019-01-07 DIAGNOSIS — E039 Hypothyroidism, unspecified: Secondary | ICD-10-CM | POA: Diagnosis not present

## 2019-01-07 DIAGNOSIS — I10 Essential (primary) hypertension: Secondary | ICD-10-CM | POA: Diagnosis not present

## 2019-01-21 DIAGNOSIS — Z23 Encounter for immunization: Secondary | ICD-10-CM | POA: Diagnosis not present

## 2019-02-10 DIAGNOSIS — R05 Cough: Secondary | ICD-10-CM | POA: Diagnosis not present

## 2019-02-25 DIAGNOSIS — Z86010 Personal history of colonic polyps: Secondary | ICD-10-CM | POA: Diagnosis not present

## 2019-02-25 DIAGNOSIS — D124 Benign neoplasm of descending colon: Secondary | ICD-10-CM | POA: Diagnosis not present

## 2019-05-30 ENCOUNTER — Emergency Department (HOSPITAL_COMMUNITY): Payer: BC Managed Care – PPO

## 2019-05-30 ENCOUNTER — Other Ambulatory Visit: Payer: Self-pay

## 2019-05-30 ENCOUNTER — Encounter (HOSPITAL_COMMUNITY): Payer: Self-pay

## 2019-05-30 ENCOUNTER — Emergency Department (HOSPITAL_COMMUNITY)
Admission: EM | Admit: 2019-05-30 | Discharge: 2019-05-30 | Disposition: A | Discharge status: Home / Self Care | Payer: BC Managed Care – PPO | Attending: Emergency Medicine | Admitting: Emergency Medicine

## 2019-05-30 DIAGNOSIS — S43014A Anterior dislocation of right humerus, initial encounter: Secondary | ICD-10-CM

## 2019-05-30 DIAGNOSIS — I10 Essential (primary) hypertension: Secondary | ICD-10-CM | POA: Diagnosis not present

## 2019-05-30 DIAGNOSIS — Y939 Activity, unspecified: Secondary | ICD-10-CM | POA: Insufficient documentation

## 2019-05-30 DIAGNOSIS — W010XXA Fall on same level from slipping, tripping and stumbling without subsequent striking against object, initial encounter: Secondary | ICD-10-CM | POA: Insufficient documentation

## 2019-05-30 DIAGNOSIS — Z79899 Other long term (current) drug therapy: Secondary | ICD-10-CM | POA: Insufficient documentation

## 2019-05-30 DIAGNOSIS — Z7722 Contact with and (suspected) exposure to environmental tobacco smoke (acute) (chronic): Secondary | ICD-10-CM | POA: Insufficient documentation

## 2019-05-30 DIAGNOSIS — S4991XA Unspecified injury of right shoulder and upper arm, initial encounter: Secondary | ICD-10-CM | POA: Diagnosis not present

## 2019-05-30 DIAGNOSIS — Y929 Unspecified place or not applicable: Secondary | ICD-10-CM | POA: Diagnosis not present

## 2019-05-30 DIAGNOSIS — Y999 Unspecified external cause status: Secondary | ICD-10-CM | POA: Diagnosis not present

## 2019-05-30 DIAGNOSIS — S43004A Unspecified dislocation of right shoulder joint, initial encounter: Secondary | ICD-10-CM | POA: Diagnosis not present

## 2019-05-30 MED ORDER — PROPOFOL 10 MG/ML IV BOLUS
INTRAVENOUS | Status: AC | PRN
  Administered 2019-05-30: 50 mg via INTRAVENOUS

## 2019-05-30 MED ORDER — PROPOFOL 10 MG/ML IV BOLUS
0.5000 mg/kg | Freq: Once | INTRAVENOUS | Status: DC
  Filled 2019-05-30: qty 20

## 2019-05-30 MED ORDER — PROPOFOL 10 MG/ML IV BOLUS
INTRAVENOUS | Status: AC | PRN
  Administered 2019-05-30: 40 mg via INTRAVENOUS

## 2019-05-30 NOTE — ED Provider Notes (Addendum)
[facility] EMERGENCY DEPARTMENT Provider Note   CSN: [id number] Arrival date & time: 05/30/19  [id number]     History Chief Complaint  Patient presents with  . Fall    [name] is a 59 y.o. female.  Patient is a 59 year old female with history of hypertension, hyperthyroidism, arthritis.  She presents today for evaluation of fall.  Patient was cleaning out the litter box when she slipped and injured her right shoulder.  She reports some numbness to her fingertips, but denies weakness.  She she denies other injury.  The history is provided by the patient.  Fall This is a new problem. The current episode started less than 1 hour ago. The problem occurs constantly. The problem has not changed since onset.Pertinent negatives include no chest pain, no headaches and no shortness of breath. Exacerbated by: Movement and palpation. Nothing relieves the symptoms. She has tried nothing for the symptoms.       Past Medical History:  Diagnosis Date  . Anemia    prior to hysterectomy   . Arthritis    right knee has injections last one is 01/12/2014  . Cellulitis    hx of in left leg treated with keflex approx 6 weeks ago   . Complication of anesthesia   . Dysrhythmia    hx of palpitations 2009 prior to dx with thyroid   . Hypertension   . Hyperthyroidism   . PONV (postoperative nausea and vomiting)   . Stroke [facility])    1995    Patient Active Problem List   Diagnosis Date Noted  . Graves' disease 01/14/2014  . Hyperthyroidism 01/14/2014    Past Surgical History:  Procedure Laterality Date  . ABDOMINAL HYSTERECTOMY    . CARPAL TUNNEL RELEASE     right  . colonscopy      polyps  . CYSTOSCOPY WITH STENT PLACEMENT Right 01/22/2013   Procedure: CYSTOSCOPY RIGHT RETROGRADE PYLEOGRAM  RIGHT URETEROSCOPY, HOLMIUM LASER WITH RIGHT STENT PLACEMENT;  Surgeon: Ardis Hughs, MD;  Location: [facility] ORS;  Service: Urology;  Laterality: Right;  . KNEE SURGERY     torn cartilage per right  knee 1976  . THYROIDECTOMY N/A 01/14/2014   Procedure: TOTAL THYROIDECTOMY;  Surgeon: Armandina Gemma, MD;  Location: [facility] ORS;  Service: General;  Laterality: N/A;  . TONSILLECTOMY       OB History   No obstetric history on file.     Family History  Problem Relation Age of Onset  . Breast cancer Sister   . Breast cancer Maternal Aunt   . Breast cancer Maternal Aunt     Social History   Tobacco Use  . Smoking status: Passive Smoke Exposure - Never Smoker  . Smokeless tobacco: Never Used  Substance Use Topics  . Alcohol use: No  . Drug use: No    Home Medications Prior to Admission medications   Medication Sig Start Date End Date Taking? Authorizing Provider  atorvastatin (LIPITOR) 10 MG tablet Take 10 mg by mouth daily at 12 noon.     [provider]  calcium-vitamin D (OSCAL WITH D) 500-200 MG-UNIT per tablet Take 1 tablet by mouth 2 (two) times daily. 01/15/14   Armandina Gemma, MD  Cholecalciferol (VITAMIN D-3) 1000 UNITS CAPS Take 1 capsule by mouth daily at 12 noon.     [provider]  clopidogrel (PLAVIX) 75 MG tablet Take 75 mg by mouth daily at 12 noon.    [provider]  ibuprofen (ADVIL,MOTRIN) 800 MG  tablet Take 800 mg by mouth 3 (three) times daily as needed for moderate pain.    [provider]  ibuprofen (ADVIL,MOTRIN) 800 MG tablet Take 1 tablet (800 mg total) by mouth every 8 (eight) hours as needed. 01/15/14   Armandina Gemma, MD  lisinopril-hydrochlorothiazide (PRINZIDE,ZESTORETIC) 20-12.5 MG per tablet Take 1 tablet by mouth daily at 12 noon.     [provider]  PRESCRIPTION MEDICATION Inject 1 each into the skin. 3 shot series of shots in right knee. One shot weekly.    [provider]  propranolol (INDERAL) 40 MG tablet Take 1 tablet (40 mg total) by mouth at bedtime. 01/15/14   Armandina Gemma, MD    Allergies    Shrimp [shellfish allergy]  Review of Systems   Review of Systems  Respiratory: Negative for  shortness of breath.   Cardiovascular: Negative for chest pain.  Neurological: Negative for headaches.    Physical Exam Updated Vital Signs BP 140/87 (BP Location: Left Arm)   Pulse 75   Temp 98.9 F (37.2 C) (Oral)   Resp 18   Ht 5\' 5"  (1.651 m)   Wt 109.8 kg   SpO2 98%   BMI 40.27 kg/m   Physical Exam Vitals and nursing note reviewed.  Constitutional:      General: She is not in acute distress.    Appearance: She is well-developed. She is not diaphoretic.  HENT:     Head: Normocephalic and atraumatic.  Cardiovascular:     Rate and Rhythm: Normal rate and regular rhythm.     Heart sounds: No murmur. No friction rub. No gallop.   Pulmonary:     Effort: Pulmonary effort is normal. No respiratory distress.     Breath sounds: Normal breath sounds. No wheezing.  Abdominal:     General: Bowel sounds are normal. There is no distension.     Palpations: Abdomen is soft.     Tenderness: There is no abdominal tenderness.  Musculoskeletal:        General: Normal range of motion.     Cervical back: Normal range of motion and neck supple.     Comments: The right shoulder has an apparent glenohumeral dislocation.  She has pain with any range of motion.  Ulnar and radial pulses are easily palpable pre and post reduction.  Patient is able to flex, extend, and oppose all fingers and motor and sensation is intact pre and post reduction.  Skin:    General: Skin is warm and dry.  Neurological:     Mental Status: She is alert and oriented to person, place, and time.     ED Results / Procedures / Treatments   Labs (all labs ordered are listed, but only abnormal results are displayed) Labs Reviewed - No data to display  EKG None  Radiology No results found.  Procedures .Sedation  Date/Time: 05/30/2019 10:44 AM Performed by: Veryl Speak, MD Authorized by: Veryl Speak, MD   Consent:    Consent obtained:  Verbal   Consent given by:  Patient   Risks discussed:  Allergic  reaction, dysrhythmia, inadequate sedation, nausea, prolonged hypoxia resulting in organ damage, prolonged sedation necessitating reversal, respiratory compromise necessitating ventilatory assistance and intubation and vomiting   Alternatives discussed:  Analgesia without sedation, anxiolysis and regional anesthesia Universal protocol:    Procedure explained and questions answered to patient or proxy's satisfaction: yes     Relevant documents present and verified: yes     Test results available and  properly labeled: yes     Imaging studies available: yes     Required blood products, implants, devices, and special equipment available: yes     Site/side marked: yes     Immediately prior to procedure a time out was called: yes     Patient identity confirmation method:  Verbally with patient Indications:    Procedure necessitating sedation performed by:  Physician performing sedation Pre-sedation assessment:    Time since last food or drink:  6 hours   ASA classification: class 1 - normal, healthy patient     Neck mobility: normal     Mouth opening:  3 or more finger widths   Thyromental distance:  4 finger widths   Mallampati score:  I - soft palate, uvula, fauces, pillars visible   Pre-sedation assessments completed and reviewed: airway patency, cardiovascular function, hydration status, mental status, nausea/vomiting, pain level, respiratory function and temperature   Immediate pre-procedure details:    Reassessment: Patient reassessed immediately prior to procedure     Reviewed: vital signs, relevant labs/tests and NPO status     Verified: bag valve mask available, emergency equipment available, intubation equipment available, IV patency confirmed, oxygen available and suction available   Procedure details (see MAR for exact dosages):    Preoxygenation:  Nasal cannula   Sedation:  Propofol   Intended level of sedation: deep   Intra-procedure monitoring:  Blood pressure monitoring,  cardiac monitor, continuous pulse oximetry, frequent LOC assessments, frequent vital sign checks and continuous capnometry   Intra-procedure events: none     Total Provider sedation time (minutes):  15 Post-procedure details:    Attendance: Constant attendance by certified staff until patient recovered     Recovery: Patient returned to pre-procedure baseline     Post-sedation assessments completed and reviewed: airway patency, cardiovascular function, hydration status, mental status, nausea/vomiting, pain level, respiratory function and temperature     Patient is stable for discharge or admission: yes     Patient tolerance:  Tolerated well, no immediate complications Comments:     Patient given propofol with good sedation.  Shoulder easily reduced with traction countertraction. Reduction of dislocation  Date/Time: 05/30/2019 10:45 AM Performed by: Veryl Speak, MD Authorized by: Veryl Speak, MD  Consent: The procedure was performed in an emergent situation. Verbal consent obtained. Written consent not obtained. Risks and benefits: risks, benefits and alternatives were discussed Consent given by: patient Patient understanding: patient states understanding of the procedure being performed Patient consent: the patient's understanding of the procedure matches consent given Procedure consent: procedure consent matches procedure scheduled Relevant documents: relevant documents present and verified Test results: test results available and properly labeled Site marked: the operative site was marked Imaging studies: imaging studies available Patient identity confirmed: verbally with patient, arm band and hospital-assigned identification number Time out: Immediately prior to procedure a "time out" was called to verify the correct patient, procedure, equipment, support staff and site/side marked as required. Preparation: Patient was prepped and draped in the usual sterile fashion. Local  anesthesia used: no  Anesthesia: Local anesthesia used: no  Sedation: Patient sedated: yes Sedation type: moderate (conscious) sedation Sedatives: propofol  Patient tolerance: patient tolerated the procedure well with no immediate complications Comments: Shoulder successfully reduced with propofol conscious sedation.  Postreduction films confirm anatomic position.  Patient tolerated this procedure well and is neurovascularly intact pre and post procedure.    (including critical care time)  Medications Ordered in ED Medications - No data to display  ED Course  I have reviewed the triage vital signs and the nursing notes.  Pertinent labs & imaging results that were available during Jefferson care of the patient were reviewed by me and considered in Jefferson medical decision making (see chart for details).    MDM Rules/Calculators/A&P  Patient presenting here with complaints of right shoulder injury.  Her x-ray shows a glenohumeral dislocation.  Conscious sedation was performed with propofol and the shoulder easily reduced.  Successful reduction confirmed with post reduction films.  Patient tolerated the procedure well.  She will be discharged with an arm sling and as needed follow-up.  Final Clinical Impression(s) / ED Diagnoses Final diagnoses:  None    Rx / DC Orders ED Discharge Orders    None       Veryl Speak, MD 05/30/19 1043    Veryl Speak, MD 05/30/19 1046    Veryl Speak, MD 06/19/19 [phone]

## 2019-05-30 NOTE — Discharge Instructions (Addendum)
Wear arm sling for the next several days, then slowly begin to reintroduce activity as tolerated.  Ice for 20 minutes every 2 hours while awake for the next 2 days.  Take ibuprofen 600 mg every 6 hours as needed for pain.

## 2019-05-30 NOTE — Sedation Documentation (Addendum)
Family updated as to patient's status per Lana Fish, Agricultural consultant. Pt gave verbal consent to give information. Norm Salt, RN, Madelyn Flavors, RN and Dr. Stark Jock were witnesses to verbal consent from pt to release information to pt's husband.

## 2019-05-30 NOTE — ED Notes (Signed)
Pt unable to sign consent due to being R handed.  Verbal consent given and witnessed by 2 Rns.

## 2019-05-30 NOTE — ED Triage Notes (Signed)
Pt reports fell this morning emptying her litter box.  C/O severe r shoulder pain.

## 2019-06-04 DIAGNOSIS — S43014D Anterior dislocation of right humerus, subsequent encounter: Secondary | ICD-10-CM | POA: Diagnosis not present

## 2019-06-04 DIAGNOSIS — M25511 Pain in right shoulder: Secondary | ICD-10-CM | POA: Diagnosis not present

## 2019-06-09 ENCOUNTER — Other Ambulatory Visit: Payer: Self-pay | Admitting: Orthopedic Surgery

## 2019-06-09 ENCOUNTER — Other Ambulatory Visit (HOSPITAL_COMMUNITY): Payer: Self-pay | Admitting: Orthopedic Surgery

## 2019-06-09 DIAGNOSIS — M25511 Pain in right shoulder: Secondary | ICD-10-CM

## 2019-06-29 DIAGNOSIS — M25511 Pain in right shoulder: Secondary | ICD-10-CM | POA: Diagnosis not present

## 2019-07-07 DIAGNOSIS — Z23 Encounter for immunization: Secondary | ICD-10-CM | POA: Diagnosis not present

## 2019-07-08 DIAGNOSIS — E039 Hypothyroidism, unspecified: Secondary | ICD-10-CM | POA: Diagnosis not present

## 2019-07-08 DIAGNOSIS — I10 Essential (primary) hypertension: Secondary | ICD-10-CM | POA: Diagnosis not present

## 2019-07-08 DIAGNOSIS — G459 Transient cerebral ischemic attack, unspecified: Secondary | ICD-10-CM | POA: Diagnosis not present

## 2019-07-08 DIAGNOSIS — E0500 Thyrotoxicosis with diffuse goiter without thyrotoxic crisis or storm: Secondary | ICD-10-CM | POA: Diagnosis not present

## 2019-07-08 DIAGNOSIS — Z79899 Other long term (current) drug therapy: Secondary | ICD-10-CM | POA: Diagnosis not present

## 2019-07-09 DIAGNOSIS — M25511 Pain in right shoulder: Secondary | ICD-10-CM | POA: Diagnosis not present

## 2019-07-09 DIAGNOSIS — M75111 Incomplete rotator cuff tear or rupture of right shoulder, not specified as traumatic: Secondary | ICD-10-CM | POA: Diagnosis not present

## 2019-07-13 DIAGNOSIS — I10 Essential (primary) hypertension: Secondary | ICD-10-CM | POA: Diagnosis not present

## 2019-07-13 DIAGNOSIS — E039 Hypothyroidism, unspecified: Secondary | ICD-10-CM | POA: Diagnosis not present

## 2019-07-13 DIAGNOSIS — E785 Hyperlipidemia, unspecified: Secondary | ICD-10-CM | POA: Diagnosis not present

## 2019-07-13 DIAGNOSIS — Z6841 Body Mass Index (BMI) 40.0 and over, adult: Secondary | ICD-10-CM | POA: Diagnosis not present

## 2019-07-14 ENCOUNTER — Ambulatory Visit (HOSPITAL_COMMUNITY): Payer: BC Managed Care – PPO

## 2019-07-29 DIAGNOSIS — Z23 Encounter for immunization: Secondary | ICD-10-CM | POA: Diagnosis not present

## 2019-08-06 ENCOUNTER — Encounter (HOSPITAL_COMMUNITY): Payer: Self-pay

## 2019-08-06 ENCOUNTER — Other Ambulatory Visit (HOSPITAL_COMMUNITY): Payer: BC Managed Care – PPO

## 2019-08-10 DIAGNOSIS — M24819 Other specific joint derangements of unspecified shoulder, not elsewhere classified: Secondary | ICD-10-CM | POA: Diagnosis not present

## 2019-08-10 DIAGNOSIS — M7541 Impingement syndrome of right shoulder: Secondary | ICD-10-CM | POA: Diagnosis not present

## 2019-08-10 DIAGNOSIS — S43431A Superior glenoid labrum lesion of right shoulder, initial encounter: Secondary | ICD-10-CM | POA: Diagnosis not present

## 2019-08-10 DIAGNOSIS — S46111A Strain of muscle, fascia and tendon of long head of biceps, right arm, initial encounter: Secondary | ICD-10-CM | POA: Diagnosis not present

## 2019-08-10 DIAGNOSIS — M7551 Bursitis of right shoulder: Secondary | ICD-10-CM | POA: Diagnosis not present

## 2019-08-10 DIAGNOSIS — G8918 Other acute postprocedural pain: Secondary | ICD-10-CM | POA: Diagnosis not present

## 2019-08-10 DIAGNOSIS — S46011A Strain of muscle(s) and tendon(s) of the rotator cuff of right shoulder, initial encounter: Secondary | ICD-10-CM | POA: Diagnosis not present

## 2019-08-13 DIAGNOSIS — M25511 Pain in right shoulder: Secondary | ICD-10-CM | POA: Diagnosis not present

## 2019-08-13 DIAGNOSIS — M25611 Stiffness of right shoulder, not elsewhere classified: Secondary | ICD-10-CM | POA: Diagnosis not present

## 2019-08-13 DIAGNOSIS — R531 Weakness: Secondary | ICD-10-CM | POA: Diagnosis not present

## 2019-08-13 DIAGNOSIS — Z4889 Encounter for other specified surgical aftercare: Secondary | ICD-10-CM | POA: Diagnosis not present

## 2019-08-18 DIAGNOSIS — M25511 Pain in right shoulder: Secondary | ICD-10-CM | POA: Diagnosis not present

## 2019-08-18 DIAGNOSIS — Z4889 Encounter for other specified surgical aftercare: Secondary | ICD-10-CM | POA: Diagnosis not present

## 2019-08-18 DIAGNOSIS — M25611 Stiffness of right shoulder, not elsewhere classified: Secondary | ICD-10-CM | POA: Diagnosis not present

## 2019-08-18 DIAGNOSIS — R531 Weakness: Secondary | ICD-10-CM | POA: Diagnosis not present

## 2019-08-20 DIAGNOSIS — R531 Weakness: Secondary | ICD-10-CM | POA: Diagnosis not present

## 2019-08-20 DIAGNOSIS — M25511 Pain in right shoulder: Secondary | ICD-10-CM | POA: Diagnosis not present

## 2019-08-20 DIAGNOSIS — M25611 Stiffness of right shoulder, not elsewhere classified: Secondary | ICD-10-CM | POA: Diagnosis not present

## 2019-08-20 DIAGNOSIS — Z4889 Encounter for other specified surgical aftercare: Secondary | ICD-10-CM | POA: Diagnosis not present

## 2019-08-24 DIAGNOSIS — Z4889 Encounter for other specified surgical aftercare: Secondary | ICD-10-CM | POA: Diagnosis not present

## 2019-08-24 DIAGNOSIS — M25511 Pain in right shoulder: Secondary | ICD-10-CM | POA: Diagnosis not present

## 2019-08-24 DIAGNOSIS — R531 Weakness: Secondary | ICD-10-CM | POA: Diagnosis not present

## 2019-08-24 DIAGNOSIS — M25611 Stiffness of right shoulder, not elsewhere classified: Secondary | ICD-10-CM | POA: Diagnosis not present

## 2019-08-27 DIAGNOSIS — M25511 Pain in right shoulder: Secondary | ICD-10-CM | POA: Diagnosis not present

## 2019-08-27 DIAGNOSIS — Z4889 Encounter for other specified surgical aftercare: Secondary | ICD-10-CM | POA: Diagnosis not present

## 2019-08-27 DIAGNOSIS — R531 Weakness: Secondary | ICD-10-CM | POA: Diagnosis not present

## 2019-08-27 DIAGNOSIS — M25611 Stiffness of right shoulder, not elsewhere classified: Secondary | ICD-10-CM | POA: Diagnosis not present

## 2019-09-01 DIAGNOSIS — M25511 Pain in right shoulder: Secondary | ICD-10-CM | POA: Diagnosis not present

## 2019-09-01 DIAGNOSIS — R531 Weakness: Secondary | ICD-10-CM | POA: Diagnosis not present

## 2019-09-01 DIAGNOSIS — M25611 Stiffness of right shoulder, not elsewhere classified: Secondary | ICD-10-CM | POA: Diagnosis not present

## 2019-09-01 DIAGNOSIS — Z4889 Encounter for other specified surgical aftercare: Secondary | ICD-10-CM | POA: Diagnosis not present

## 2019-09-03 DIAGNOSIS — M25511 Pain in right shoulder: Secondary | ICD-10-CM | POA: Diagnosis not present

## 2019-09-03 DIAGNOSIS — M25611 Stiffness of right shoulder, not elsewhere classified: Secondary | ICD-10-CM | POA: Diagnosis not present

## 2019-09-03 DIAGNOSIS — R531 Weakness: Secondary | ICD-10-CM | POA: Diagnosis not present

## 2019-09-03 DIAGNOSIS — Z4889 Encounter for other specified surgical aftercare: Secondary | ICD-10-CM | POA: Diagnosis not present

## 2019-09-04 ENCOUNTER — Other Ambulatory Visit: Payer: Self-pay

## 2019-09-04 ENCOUNTER — Other Ambulatory Visit (HOSPITAL_COMMUNITY): Payer: Self-pay | Admitting: Internal Medicine

## 2019-09-04 ENCOUNTER — Ambulatory Visit (HOSPITAL_COMMUNITY)
Admission: RE | Admit: 2019-09-04 | Discharge: 2019-09-04 | Disposition: A | Discharge status: Home / Self Care | Payer: BC Managed Care – PPO | Source: Ambulatory Visit | Attending: Internal Medicine | Admitting: Internal Medicine

## 2019-09-04 DIAGNOSIS — J986 Disorders of diaphragm: Secondary | ICD-10-CM

## 2019-09-04 DIAGNOSIS — J939 Pneumothorax, unspecified: Secondary | ICD-10-CM | POA: Diagnosis not present

## 2019-09-08 DIAGNOSIS — R531 Weakness: Secondary | ICD-10-CM | POA: Diagnosis not present

## 2019-09-08 DIAGNOSIS — M25611 Stiffness of right shoulder, not elsewhere classified: Secondary | ICD-10-CM | POA: Diagnosis not present

## 2019-09-08 DIAGNOSIS — M25511 Pain in right shoulder: Secondary | ICD-10-CM | POA: Diagnosis not present

## 2019-09-08 DIAGNOSIS — Z4889 Encounter for other specified surgical aftercare: Secondary | ICD-10-CM | POA: Diagnosis not present

## 2019-09-10 DIAGNOSIS — M25511 Pain in right shoulder: Secondary | ICD-10-CM | POA: Diagnosis not present

## 2019-09-10 DIAGNOSIS — M25611 Stiffness of right shoulder, not elsewhere classified: Secondary | ICD-10-CM | POA: Diagnosis not present

## 2019-09-10 DIAGNOSIS — Z4889 Encounter for other specified surgical aftercare: Secondary | ICD-10-CM | POA: Diagnosis not present

## 2019-09-10 DIAGNOSIS — R531 Weakness: Secondary | ICD-10-CM | POA: Diagnosis not present

## 2019-09-15 DIAGNOSIS — Z4889 Encounter for other specified surgical aftercare: Secondary | ICD-10-CM | POA: Diagnosis not present

## 2019-09-15 DIAGNOSIS — R531 Weakness: Secondary | ICD-10-CM | POA: Diagnosis not present

## 2019-09-15 DIAGNOSIS — M25511 Pain in right shoulder: Secondary | ICD-10-CM | POA: Diagnosis not present

## 2019-09-15 DIAGNOSIS — M25611 Stiffness of right shoulder, not elsewhere classified: Secondary | ICD-10-CM | POA: Diagnosis not present

## 2019-09-17 DIAGNOSIS — M25511 Pain in right shoulder: Secondary | ICD-10-CM | POA: Diagnosis not present

## 2019-09-17 DIAGNOSIS — Z4889 Encounter for other specified surgical aftercare: Secondary | ICD-10-CM | POA: Diagnosis not present

## 2019-09-17 DIAGNOSIS — R531 Weakness: Secondary | ICD-10-CM | POA: Diagnosis not present

## 2019-09-17 DIAGNOSIS — M25611 Stiffness of right shoulder, not elsewhere classified: Secondary | ICD-10-CM | POA: Diagnosis not present

## 2019-09-21 DIAGNOSIS — M25611 Stiffness of right shoulder, not elsewhere classified: Secondary | ICD-10-CM | POA: Diagnosis not present

## 2019-09-21 DIAGNOSIS — Z4889 Encounter for other specified surgical aftercare: Secondary | ICD-10-CM | POA: Diagnosis not present

## 2019-09-21 DIAGNOSIS — M25511 Pain in right shoulder: Secondary | ICD-10-CM | POA: Diagnosis not present

## 2019-09-21 DIAGNOSIS — R531 Weakness: Secondary | ICD-10-CM | POA: Diagnosis not present

## 2019-09-24 DIAGNOSIS — Z4889 Encounter for other specified surgical aftercare: Secondary | ICD-10-CM | POA: Diagnosis not present

## 2019-09-24 DIAGNOSIS — M25511 Pain in right shoulder: Secondary | ICD-10-CM | POA: Diagnosis not present

## 2019-09-24 DIAGNOSIS — R531 Weakness: Secondary | ICD-10-CM | POA: Diagnosis not present

## 2019-09-24 DIAGNOSIS — M25611 Stiffness of right shoulder, not elsewhere classified: Secondary | ICD-10-CM | POA: Diagnosis not present

## 2019-09-29 DIAGNOSIS — M25611 Stiffness of right shoulder, not elsewhere classified: Secondary | ICD-10-CM | POA: Diagnosis not present

## 2019-09-29 DIAGNOSIS — Z4889 Encounter for other specified surgical aftercare: Secondary | ICD-10-CM | POA: Diagnosis not present

## 2019-09-29 DIAGNOSIS — M25511 Pain in right shoulder: Secondary | ICD-10-CM | POA: Diagnosis not present

## 2019-09-29 DIAGNOSIS — R531 Weakness: Secondary | ICD-10-CM | POA: Diagnosis not present

## 2019-10-08 DIAGNOSIS — M25611 Stiffness of right shoulder, not elsewhere classified: Secondary | ICD-10-CM | POA: Diagnosis not present

## 2019-10-08 DIAGNOSIS — Z4889 Encounter for other specified surgical aftercare: Secondary | ICD-10-CM | POA: Diagnosis not present

## 2019-10-08 DIAGNOSIS — R531 Weakness: Secondary | ICD-10-CM | POA: Diagnosis not present

## 2019-10-08 DIAGNOSIS — M25511 Pain in right shoulder: Secondary | ICD-10-CM | POA: Diagnosis not present

## 2019-10-13 DIAGNOSIS — Z4889 Encounter for other specified surgical aftercare: Secondary | ICD-10-CM | POA: Diagnosis not present

## 2019-10-13 DIAGNOSIS — M25511 Pain in right shoulder: Secondary | ICD-10-CM | POA: Diagnosis not present

## 2019-10-13 DIAGNOSIS — R531 Weakness: Secondary | ICD-10-CM | POA: Diagnosis not present

## 2019-10-13 DIAGNOSIS — M25611 Stiffness of right shoulder, not elsewhere classified: Secondary | ICD-10-CM | POA: Diagnosis not present

## 2019-10-20 DIAGNOSIS — Z4889 Encounter for other specified surgical aftercare: Secondary | ICD-10-CM | POA: Diagnosis not present

## 2019-10-20 DIAGNOSIS — R531 Weakness: Secondary | ICD-10-CM | POA: Diagnosis not present

## 2019-10-20 DIAGNOSIS — M25611 Stiffness of right shoulder, not elsewhere classified: Secondary | ICD-10-CM | POA: Diagnosis not present

## 2019-10-20 DIAGNOSIS — M25511 Pain in right shoulder: Secondary | ICD-10-CM | POA: Diagnosis not present

## 2019-10-27 DIAGNOSIS — Z4889 Encounter for other specified surgical aftercare: Secondary | ICD-10-CM | POA: Diagnosis not present

## 2019-10-27 DIAGNOSIS — R531 Weakness: Secondary | ICD-10-CM | POA: Diagnosis not present

## 2019-10-27 DIAGNOSIS — M25611 Stiffness of right shoulder, not elsewhere classified: Secondary | ICD-10-CM | POA: Diagnosis not present

## 2019-10-27 DIAGNOSIS — M25511 Pain in right shoulder: Secondary | ICD-10-CM | POA: Diagnosis not present

## 2019-10-29 DIAGNOSIS — H04123 Dry eye syndrome of bilateral lacrimal glands: Secondary | ICD-10-CM | POA: Diagnosis not present

## 2019-10-29 DIAGNOSIS — H2513 Age-related nuclear cataract, bilateral: Secondary | ICD-10-CM | POA: Diagnosis not present

## 2019-10-29 DIAGNOSIS — H43813 Vitreous degeneration, bilateral: Secondary | ICD-10-CM | POA: Diagnosis not present

## 2019-10-29 DIAGNOSIS — D492 Neoplasm of unspecified behavior of bone, soft tissue, and skin: Secondary | ICD-10-CM | POA: Diagnosis not present

## 2019-11-03 DIAGNOSIS — M1711 Unilateral primary osteoarthritis, right knee: Secondary | ICD-10-CM | POA: Diagnosis not present

## 2019-11-05 DIAGNOSIS — R531 Weakness: Secondary | ICD-10-CM | POA: Diagnosis not present

## 2019-11-05 DIAGNOSIS — M25511 Pain in right shoulder: Secondary | ICD-10-CM | POA: Diagnosis not present

## 2019-11-05 DIAGNOSIS — M25611 Stiffness of right shoulder, not elsewhere classified: Secondary | ICD-10-CM | POA: Diagnosis not present

## 2019-11-05 DIAGNOSIS — Z4889 Encounter for other specified surgical aftercare: Secondary | ICD-10-CM | POA: Diagnosis not present

## 2019-11-10 ENCOUNTER — Ambulatory Visit: Payer: Self-pay | Admitting: Student

## 2019-11-10 DIAGNOSIS — Z4889 Encounter for other specified surgical aftercare: Secondary | ICD-10-CM | POA: Diagnosis not present

## 2019-11-10 DIAGNOSIS — R531 Weakness: Secondary | ICD-10-CM | POA: Diagnosis not present

## 2019-11-10 DIAGNOSIS — M25611 Stiffness of right shoulder, not elsewhere classified: Secondary | ICD-10-CM | POA: Diagnosis not present

## 2019-11-10 DIAGNOSIS — M25511 Pain in right shoulder: Secondary | ICD-10-CM | POA: Diagnosis not present

## 2019-11-12 ENCOUNTER — Other Ambulatory Visit: Payer: Self-pay | Admitting: Internal Medicine

## 2019-11-12 DIAGNOSIS — Z1231 Encounter for screening mammogram for malignant neoplasm of breast: Secondary | ICD-10-CM

## 2019-11-20 DIAGNOSIS — Z4889 Encounter for other specified surgical aftercare: Secondary | ICD-10-CM | POA: Diagnosis not present

## 2019-11-20 DIAGNOSIS — M25611 Stiffness of right shoulder, not elsewhere classified: Secondary | ICD-10-CM | POA: Diagnosis not present

## 2019-11-20 DIAGNOSIS — R531 Weakness: Secondary | ICD-10-CM | POA: Diagnosis not present

## 2019-11-20 DIAGNOSIS — M25511 Pain in right shoulder: Secondary | ICD-10-CM | POA: Diagnosis not present

## 2019-12-01 DIAGNOSIS — M25511 Pain in right shoulder: Secondary | ICD-10-CM | POA: Diagnosis not present

## 2019-12-24 ENCOUNTER — Encounter (HOSPITAL_COMMUNITY): Payer: Self-pay

## 2019-12-24 NOTE — Progress Notes (Signed)
COVID Vaccine Completed: Date COVID Vaccine completed: COVID vaccine manufacturer: Pf[employer]    [employer]   PCP - Dr. Asencion Noble Cardiologist -   Chest x-ray - 09/05/19 in epic EKG -  Stress Test -  ECHO - greater than 2 years Cardiac Cath -  Pacemaker/ICD device last checked:  Sleep Study -  CPAP -   Fasting Blood Sugar -  Checks Blood Sugar _____ times a day  Blood Thinner Instructions: Plavix Aspirin Instructions: Last Dose:  Anesthesia review: History Stroke  Patient denies shortness of breath, fever, cough and chest pain at PAT appointment   Patient verbalized understanding of instructions that were given to them at the PAT appointment. Patient was also instructed that they will need to review over the PAT instructions again at home before surgery.

## 2019-12-24 NOTE — Patient Instructions (Addendum)
DUE TO COVID-19 ONLY ONE VISITOR IS ALLOWED TO COME WITH YOU AND STAY IN THE WAITING ROOM ONLY DURING PRE OP AND PROCEDURE.     COVID SWAB TESTING MUST BE COMPLETED ON: Friday January 01, 2020 2:35pm   [address] [place], [place] [address]  (Must self quarantine after testing. Follow instructions on handout.)       Your procedure is scheduled on: Wednesday, Oct. 20, 2021   Report to [facility] Main  Entrance   Report to Short Stay at 6:00 AM   [facility])   Call this number if you have problems the morning of surgery [phone]   Do not eat food :After Midnight.   May have liquids until  5:30AM  day of surgery  CLEAR LIQUID DIET  Foods Allowed                                                                     Foods Excluded  Water, Black Coffee and tea, regular and decaf                             liquids that you cannot  Plain Jell-O in any flavor  (No red)                                           see through such as: Fruit ices (not with fruit pulp)                                     milk, soups, orange juice              Iced Popsicles (No red)                                    All solid food                                   Apple juices Sports drinks like Gatorade (No red) Lightly seasoned clear broth or consume(fat free) Sugar, honey syrup  Sample Menu Breakfast                                Lunch                                     Supper Cranberry juice                    Beef broth                            Chicken broth Jell-O  Grape juice                           Apple juice Coffee or tea                        Jell-O                                      Popsicle                                                Coffee or tea                        Coffee or tea      Complete one Ensure drink the morning of surgery at  5:30AM     the day of surgery.   Oral Hygiene is also important to reduce your risk of  infection.                                    Remember - BRUSH YOUR TEETH THE MORNING OF SURGERY WITH YOUR REGULAR TOOTHPASTE   Do NOT smoke after Midnight   Take these medicines the morning of surgery with A SIP OF WATER: Escitalopram, Levothyroxine                               You may not have any metal on your body including hair pins, jewelry, and body piercings             Do not wear make-up, lotions, powders, perfumes/cologne, or deodorant             Do not wear nail polish.  Do not shave  48 hours prior to surgery.                Do not bring valuables to the hospital. [place].   Contacts, dentures or bridgework may not be worn into surgery.    Patients discharged the day of surgery will not be allowed to drive home.   Special Instructions: Bring a copy of your healthcare power of attorney and living will documents         the day of surgery if you haven't scanned them in before.              Please read over the following fact sheets you were given: IF YOU HAVE QUESTIONS ABOUT YOUR PRE OP INSTRUCTIONS PLEASE CALL [phone]   [facility] - Preparing for Surgery Before surgery, you can play an important role.  Because skin is not sterile, your skin needs to be as free of germs as possible.  You can reduce the number of germs on your skin by washing with CHG (chlorahexidine gluconate) soap before surgery.  CHG is an antiseptic cleaner which kills germs and bonds with the skin to continue killing germs even after washing. Please DO NOT use if you have an allergy to  CHG or antibacterial soaps.  If your skin becomes reddened/irritated stop using the CHG and inform your nurse when you arrive at Short Stay. Do not shave (including legs and underarms) for at least 48 hours prior to the first CHG shower.  You may shave your face/neck.  Please follow these instructions carefully:  1.  Shower with CHG Soap the night before surgery and  the  morning of surgery.  2.  If you choose to wash your hair, wash your hair first as usual with your normal  shampoo.  3.  After you shampoo, rinse your hair and body thoroughly to remove the shampoo.                             4.  Use CHG as you would any other liquid soap.  You can apply chg directly to the skin and wash.  Gently with a scrungie or clean washcloth.  5.  Apply the CHG Soap to your body ONLY FROM THE NECK DOWN.   Do   not use on face/ open                           Wound or open sores. Avoid contact with eyes, ears mouth and   genitals (private parts).                       Wash face,  Genitals (private parts) with your normal soap.             6.  Wash thoroughly, paying special attention to the area where your    surgery  will be performed.  7.  Thoroughly rinse your body with warm water from the neck down.  8.  DO NOT shower/wash with your normal soap after using and rinsing off the CHG Soap.                9.  Pat yourself dry with a clean towel.            10.  Wear clean pajamas.            11.  Place clean sheets on your bed the night of your first shower and do not  sleep with pets. Day of Surgery : Do not apply any lotions/deodorants the morning of surgery.  Please wear clean clothes to the hospital/surgery center.  FAILURE TO FOLLOW THESE INSTRUCTIONS MAY RESULT IN THE CANCELLATION OF YOUR SURGERY  PATIENT SIGNATURE_________________________________  NURSE SIGNATURE__________________________________  ________________________________________________________________________   [name]  An incentive spirometer is a tool that can help keep your lungs clear and active. This tool measures how well you are filling your lungs with each breath. Taking long deep breaths may help reverse or decrease the chance of developing breathing (pulmonary) problems (especially infection) following:  A long period of time when you are unable to move or be  active. BEFORE THE PROCEDURE   If the spirometer includes an indicator to show your best effort, your nurse or respiratory therapist will set it to a desired goal.  If possible, sit up straight or lean slightly forward. Try not to slouch.  Hold the incentive spirometer in an upright position. INSTRUCTIONS FOR USE  1. Sit on the edge of your bed if possible, or sit up as far as you can in bed or on a chair. 2. Hold the incentive spirometer in an  upright position. 3. Breathe out normally. 4. Place the mouthpiece in your mouth and seal your lips tightly around it. 5. Breathe in slowly and as deeply as possible, raising the piston or the ball toward the top of the column. 6. Hold your breath for 3-5 seconds or for as long as possible. Allow the piston or ball to fall to the bottom of the column. 7. Remove the mouthpiece from your mouth and breathe out normally. 8. Rest for a few seconds and repeat Steps 1 through 7 at least 10 times every 1-2 hours when you are awake. Take your time and take a few normal breaths between deep breaths. 9. The spirometer may include an indicator to show your best effort. Use the indicator as a goal to work toward during each repetition. 10. After each set of 10 deep breaths, practice coughing to be sure your lungs are clear. If you have an incision (the cut made at the time of surgery), support your incision when coughing by placing a pillow or rolled up towels firmly against it. Once you are able to get out of bed, walk around indoors and cough well. You may stop using the incentive spirometer when instructed by your caregiver.  RISKS AND COMPLICATIONS  Take your time so you do not get dizzy or light-headed.  If you are in pain, you may need to take or ask for pain medication before doing incentive spirometry. It is harder to take a deep breath if you are having pain. AFTER USE  Rest and breathe slowly and easily.  It can be helpful to keep track of a log of  your progress. Your caregiver can provide you with a simple table to help with this. If you are using the spirometer at home, follow these instructions: [place] IF:   You are having difficultly using the spirometer.  You have trouble using the spirometer as often as instructed.  Your pain medication is not giving enough relief while using the spirometer.  You develop fever of 100.5 F (38.1 C) or higher. SEEK IMMEDIATE MEDICAL CARE IF:   You cough up bloody sputum that had not been present before.  You develop fever of 102 F (38.9 C) or greater.  You develop worsening pain at or near the incision site. MAKE SURE YOU:   Understand these instructions.  Will watch your condition.  Will get help right away if you are not doing well or get worse. Document Released: 07/16/2006 Document Revised: 05/28/2011 Document Reviewed: 09/16/2006 Exit[employer] Patient Information 2014 [employer], [place].   ________________________________________________________________________  WHAT IS A BLOOD TRANSFUSION? Blood Transfusion Information  A transfusion is the replacement of blood or some of its parts. Blood is made up of multiple cells which provide different functions.  Red blood cells carry oxygen and are used for blood loss replacement.  White blood cells fight against infection.  Platelets control bleeding.  Plasma helps clot blood.  Other blood products are available for specialized needs, such as hemophilia or other clotting disorders. BEFORE THE TRANSFUSION  Who gives blood for transfusions?   Healthy volunteers who are fully evaluated to make sure their blood is safe. This is blood bank blood. Transfusion therapy is the safest it has ever been in the practice of medicine. Before blood is taken from a donor, a complete history is taken to make sure that person has no history of diseases nor engages in risky social behavior (examples are intravenous drug use or sexual activity  with  multiple partners). The donor's travel history is screened to minimize risk of transmitting infections, such as malaria. The donated blood is tested for signs of infectious diseases, such as HIV and hepatitis. The blood is then tested to be sure it is compatible with you in order to minimize the chance of a transfusion reaction. If you or a relative donates blood, this is often done in anticipation of surgery and is not appropriate for emergency situations. It takes many days to process the donated blood. RISKS AND COMPLICATIONS Although transfusion therapy is very safe and saves many lives, the main dangers of transfusion include:   Getting an infectious disease.  Developing a transfusion reaction. This is an allergic reaction to something in the blood you were given. Every precaution is taken to prevent this. The decision to have a blood transfusion has been considered carefully by your caregiver before blood is given. Blood is not given unless the benefits outweigh the risks. AFTER THE TRANSFUSION  Right after receiving a blood transfusion, you will usually feel much better and more energetic. This is especially true if your red blood cells have gotten low (anemic). The transfusion raises the level of the red blood cells which carry oxygen, and this usually causes an energy increase.  The nurse administering the transfusion will monitor you carefully for complications. HOME CARE INSTRUCTIONS  No special instructions are needed after a transfusion. You may find your energy is better. Speak with your caregiver about any limitations on activity for underlying diseases you may have. SEEK MEDICAL CARE IF:   Your condition is not improving after your transfusion.  You develop redness or irritation at the intravenous (IV) site. SEEK IMMEDIATE MEDICAL CARE IF:  Any of the following symptoms occur over the next 12 hours:  Shaking chills.  You have a temperature by mouth above 102 F (38.9  C), not controlled by medicine.  Chest, back, or muscle pain.  People around you feel you are not acting correctly or are confused.  Shortness of breath or difficulty breathing.  Dizziness and fainting.  You get a rash or develop hives.  You have a decrease in urine output.  Your urine turns a dark color or changes to pink, red, or Grosser. Any of the following symptoms occur over the next 10 days:  You have a temperature by mouth above 102 F (38.9 C), not controlled by medicine.  Shortness of breath.  Weakness after normal activity.  The white part of the eye turns yellow (jaundice).  You have a decrease in the amount of urine or are urinating less often.  Your urine turns a dark color or changes to pink, red, or Klinke. Document Released: 03/02/2000 Document Revised: 05/28/2011 Document Reviewed: 10/20/2007 [facility] Patient Information 2014 [place], [place].  _______________________________________________________________________

## 2019-12-25 ENCOUNTER — Ambulatory Visit
Admission: RE | Admit: 2019-12-25 | Discharge: 2019-12-25 | Disposition: A | Discharge status: Home / Self Care | Payer: BC Managed Care – PPO | Source: Ambulatory Visit | Attending: Internal Medicine | Admitting: Internal Medicine

## 2019-12-25 ENCOUNTER — Other Ambulatory Visit: Payer: Self-pay

## 2019-12-25 DIAGNOSIS — Z1231 Encounter for screening mammogram for malignant neoplasm of breast: Secondary | ICD-10-CM

## 2019-12-29 ENCOUNTER — Encounter (HOSPITAL_COMMUNITY): Payer: Self-pay

## 2019-12-29 ENCOUNTER — Encounter (HOSPITAL_COMMUNITY)
Admission: RE | Admit: 2019-12-29 | Discharge: 2019-12-29 | Disposition: A | Discharge status: Home / Self Care | Payer: BC Managed Care – PPO | Source: Ambulatory Visit | Attending: Orthopedic Surgery | Admitting: Orthopedic Surgery

## 2019-12-29 ENCOUNTER — Ambulatory Visit: Payer: Self-pay | Admitting: Student

## 2019-12-29 ENCOUNTER — Other Ambulatory Visit: Payer: Self-pay

## 2019-12-29 DIAGNOSIS — Z01818 Encounter for other preprocedural examination: Secondary | ICD-10-CM | POA: Diagnosis not present

## 2019-12-29 HISTORY — DX: Personal history of other medical treatment: Z92.89

## 2019-12-29 HISTORY — DX: Personal history of urinary calculi: Z87.442

## 2019-12-29 LAB — CBC
HCT: 41.5 % (ref 36.0–46.0)
Hemoglobin: 14.5 g/dL (ref 12.0–15.0)
MCH: 31.6 pg (ref 26.0–34.0)
MCHC: 34.9 g/dL (ref 30.0–36.0)
MCV: 90.4 fL (ref 80.0–100.0)
Platelets: 257 10*3/uL (ref 150–400)
RBC: 4.59 MIL/uL (ref 3.87–5.11)
RDW: 12.7 % (ref 11.5–15.5)
WBC: 6.2 10*3/uL (ref 4.0–10.5)
nRBC: 0 % (ref 0.0–0.2)

## 2019-12-29 LAB — COMPREHENSIVE METABOLIC PANEL
ALT: 18 U/L (ref 0–44)
AST: 22 U/L (ref 15–41)
Albumin: 4.4 g/dL (ref 3.5–5.0)
Alkaline Phosphatase: 110 U/L (ref 38–126)
Anion gap: 11 (ref 5–15)
BUN: 15 mg/dL (ref 8–23)
CO2: 28 mmol/L (ref 22–32)
Calcium: 9.5 mg/dL (ref 8.9–10.3)
Chloride: 102 mmol/L (ref 98–111)
Creatinine, Ser: 0.8 mg/dL (ref 0.44–1.00)
GFR, Estimated: >60 mL/min (ref >60)
Glucose, Bld: 95 mg/dL (ref 70–99)
Potassium: 4.5 mmol/L (ref 3.5–5.1)
Sodium: 141 mmol/L (ref 135–145)
Total Bilirubin: 0.9 mg/dL (ref 0.3–1.2)
Total Protein: 7.3 g/dL (ref 6.5–8.1)

## 2019-12-29 LAB — SURGICAL PCR SCREEN
MRSA, PCR: NEGATIVE
Staphylococcus aureus: NEGATIVE

## 2019-12-29 LAB — PROTIME-INR
INR: 1 (ref 0.8–1.2)
Prothrombin Time: 13.1 seconds (ref 11.4–15.2)

## 2019-12-29 NOTE — H&P (View-Only) (Signed)
TOTAL KNEE ADMISSION H&P  Patient is being admitted for right total knee arthroplasty.  Subjective:  Chief Complaint:right knee pain.  HPI: [name], 59 y.o. female, has a history of pain and functional disability in the right knee due to arthritis and has failed non-surgical conservative treatments for greater than 12 weeks to includeNSAID's and/or analgesics, corticosteriod injections, viscosupplementation injections, weight reduction as appropriate and activity modification.  Onset of symptoms was gradual, starting 4 years ago with gradually worsening course since that time. The patient noted no past surgery on the right knee(s).  Patient currently rates pain in the right knee(s) at 8 out of 10 with activity. Patient has worsening of pain with activity and weight bearing, pain that interferes with activities of daily living and pain with passive range of motion.  Patient has evidence of subchondral cysts, subchondral sclerosis and joint space narrowing by imaging studies. There is no active infection.  Patient Active Problem List   Diagnosis Date Noted   Graves' disease 01/14/2014   Hyperthyroidism 01/14/2014   Past Medical History:  Diagnosis Date   Anemia    prior to hysterectomy    Arthritis    right knee has injections last one is 01/12/2014   Cellulitis    hx of in left leg treated with keflex approx 6 weeks ago    Complication of anesthesia    Dysrhythmia    hx of palpitations 2009 prior to dx with thyroid    History of blood transfusion    History of kidney stones    Hypertension    Hyperthyroidism    Incomplete RBBB 2015   Noted on EKG   PONV (postoperative nausea and vomiting)    Stroke ([place])    1995    Past Surgical History:  Procedure Laterality Date   ABDOMINAL HYSTERECTOMY     CARPAL TUNNEL RELEASE     right   colonscopy      polyps   CYSTOSCOPY WITH STENT PLACEMENT Right 01/22/2013   Procedure: CYSTOSCOPY RIGHT RETROGRADE PYLEOGRAM   RIGHT URETEROSCOPY, HOLMIUM LASER WITH RIGHT STENT PLACEMENT;  Surgeon: Ardis Hughs, MD;  Location: [facility];  Service: Urology;  Laterality: Right;   KNEE SURGERY     torn cartilage per right knee 1976   THYROIDECTOMY N/A 01/14/2014   Procedure: TOTAL THYROIDECTOMY;  Surgeon: Armandina Gemma, MD;  Location: [facility];  Service: General;  Laterality: N/A;   TONSILLECTOMY      Current Outpatient Medications  Medication Sig Dispense Refill Last Dose   atorvastatin (LIPITOR) 10 MG tablet Take 10 mg by mouth every evening.       clopidogrel (PLAVIX) 75 MG tablet Take 75 mg by mouth every evening.       escitalopram (LEXAPRO) 10 MG tablet Take 10 mg by mouth daily.      ibuprofen (ADVIL,MOTRIN) 800 MG tablet Take 1 tablet (800 mg total) by mouth every 8 (eight) hours as needed. (Patient taking differently: Take 800 mg by mouth every evening. ) 30 tablet 0    levothyroxine (SYNTHROID) 100 MCG tablet Take 100 mcg by mouth daily before breakfast.      losartan-hydrochlorothiazide (HYZAAR) 100-12.5 MG tablet Take 1 tablet by mouth every evening.      propranolol (INDERAL) 40 MG tablet Take 1 tablet (40 mg total) by mouth at bedtime. (Patient not taking: Reported on 12/17/2019) 30 tablet 0    No current facility-administered medications for this visit.   Allergies  Allergen Reactions   Shrimp [  Shellfish Allergy] Anaphylaxis   Lisinopril Cough    Social History   Tobacco Use   Smoking status: Passive Smoke Exposure - Never Smoker   Smokeless tobacco: Never Used  Substance Use Topics   Alcohol use: No    Family History  Problem Relation Age of Onset   Breast cancer Sister    Breast cancer Maternal Aunt    Breast cancer Maternal Aunt      Review of Systems  Respiratory: Positive for shortness of breath.   Musculoskeletal: Positive for arthralgias.  All other systems reviewed and are negative.   Objective:  Physical Exam Constitutional:      Appearance: She is obese.   HENT:     Head: Normocephalic.  Eyes:     Pupils: Pupils are equal, round, and reactive to light.  Cardiovascular:     Rate and Rhythm: Regular rhythm.     Pulses: Normal pulses.     Heart sounds: Normal heart sounds.  Pulmonary:     Breath sounds: Normal breath sounds.  Abdominal:     Palpations: Abdomen is soft.     Tenderness: There is no abdominal tenderness.  Genitourinary:    Comments: Deferred Musculoskeletal:     Cervical back: Normal range of motion.     Comments: Examination of the right knee reveals a healed lateral incision. She has a valgus deformity. She has got some swelling, trace effusion. No warmth or erythema. Range of motion of the right knee is 10-100 without instability. Range of motion of the contralateral knee is 0-130. Painless range of motion of the hips.  Skin:    General: Skin is warm and dry.  Neurological:     Mental Status: She is alert and oriented to person, place, and time.  Psychiatric:        Mood and Affect: Mood normal.     Vital signs in last 24 hours: @VSRANGES @  Labs:   Estimated body mass index is 44.91 kg/m as calculated from the following:   Height as of an earlier encounter on 12/29/19: 5\' 2"  (1.575 m).   Weight as of an earlier encounter on 12/29/19: 111.4 kg.   Imaging Review Plain radiographs demonstrate severe degenerative joint disease of the right knee(s). The overall alignment issignificant valgus. The bone quality appears to be adequate for age and reported activity level.      Assessment/Plan:  End stage arthritis, right knee   The patient history, physical examination, clinical judgment of the provider and imaging studies are consistent with end stage degenerative joint disease of the right knee(s) and total knee arthroplasty is deemed medically necessary. The treatment options including medical management, injection therapy arthroscopy and arthroplasty were discussed at length. The risks and benefits of  total knee arthroplasty were presented and reviewed. The risks due to aseptic loosening, infection, stiffness, patella tracking problems, thromboembolic complications and other imponderables were discussed. The patient acknowledged the explanation, agreed to proceed with the plan and consent was signed. Patient is being admitted for inpatient treatment for surgery, pain control, PT, OT, prophylactic antibiotics, VTE prophylaxis, progressive ambulation and ADL's and discharge planning. The patient is planning to be discharged home.     Patient's anticipated LOS is less than 2 midnights, meeting these requirements: - Younger than 25 - Lives within 1 hour of care - Has a competent adult at home to recover with post-op recover - NO history of  - Chronic pain requiring opiods  - Diabetes  - Coronary Artery Disease  -  Heart failure  - Heart attack  - Stroke  - DVT/VTE  - Cardiac arrhythmia  - Respiratory Failure/COPD  - Renal failure  - Anemia  - Advanced Liver disease

## 2019-12-29 NOTE — H&P (Signed)
TOTAL KNEE ADMISSION H&P  Patient is being admitted for right total knee arthroplasty.  Subjective:  Chief Complaint:right knee pain.  HPI: [name], 59 y.o. female, has a history of pain and functional disability in the right knee due to arthritis and has failed non-surgical conservative treatments for greater than 12 weeks to includeNSAID's and/or analgesics, corticosteriod injections, viscosupplementation injections, weight reduction as appropriate and activity modification.  Onset of symptoms was gradual, starting 4 years ago with gradually worsening course since that time. The patient noted no past surgery on the right knee(s).  Patient currently rates pain in the right knee(s) at 8 out of 10 with activity. Patient has worsening of pain with activity and weight bearing, pain that interferes with activities of daily living and pain with passive range of motion.  Patient has evidence of subchondral cysts, subchondral sclerosis and joint space narrowing by imaging studies. There is no active infection.  Patient Active Problem List   Diagnosis Date Noted  . Graves' disease 01/14/2014  . Hyperthyroidism 01/14/2014   Past Medical History:  Diagnosis Date  . Anemia    prior to hysterectomy   . Arthritis    right knee has injections last one is 01/12/2014  . Cellulitis    hx of in left leg treated with keflex approx 6 weeks ago   . Complication of anesthesia   . Dysrhythmia    hx of palpitations 2009 prior to dx with thyroid   . History of blood transfusion   . History of kidney stones   . Hypertension   . Hyperthyroidism   . Incomplete RBBB 2015   Noted on EKG  . PONV (postoperative nausea and vomiting)   . Stroke [facility])    1995    Past Surgical History:  Procedure Laterality Date  . ABDOMINAL HYSTERECTOMY    . CARPAL TUNNEL RELEASE     right  . colonscopy      polyps  . CYSTOSCOPY WITH STENT PLACEMENT Right 01/22/2013   Procedure: CYSTOSCOPY RIGHT RETROGRADE PYLEOGRAM   RIGHT URETEROSCOPY, HOLMIUM LASER WITH RIGHT STENT PLACEMENT;  Surgeon: Ardis Hughs, MD;  Location: [facility];  Service: Urology;  Laterality: Right;  . KNEE SURGERY     torn cartilage per right knee 1976  . THYROIDECTOMY N/A 01/14/2014   Procedure: TOTAL THYROIDECTOMY;  Surgeon: Armandina Gemma, MD;  Location: [facility];  Service: General;  Laterality: N/A;  . TONSILLECTOMY      Current Outpatient Medications  Medication Sig Dispense Refill Last Dose  . atorvastatin (LIPITOR) 10 MG tablet Take 10 mg by mouth every evening.      . clopidogrel (PLAVIX) 75 MG tablet Take 75 mg by mouth every evening.      . escitalopram (LEXAPRO) 10 MG tablet Take 10 mg by mouth daily.     Marland Kitchen ibuprofen (ADVIL,MOTRIN) 800 MG tablet Take 1 tablet (800 mg total) by mouth every 8 (eight) hours as needed. (Patient taking differently: Take 800 mg by mouth every evening. ) 30 tablet 0   . levothyroxine (SYNTHROID) 100 MCG tablet Take 100 mcg by mouth daily before breakfast.     . losartan-hydrochlorothiazide (HYZAAR) 100-12.5 MG tablet Take 1 tablet by mouth every evening.     . propranolol (INDERAL) 40 MG tablet Take 1 tablet (40 mg total) by mouth at bedtime. (Patient not taking: Reported on 12/17/2019) 30 tablet 0    No current facility-administered medications for this visit.   Allergies  Allergen Reactions  . Shrimp [  Shellfish Allergy] Anaphylaxis  . Lisinopril Cough    Social History   Tobacco Use  . Smoking status: Passive Smoke Exposure - Never Smoker  . Smokeless tobacco: Never Used  Substance Use Topics  . Alcohol use: No    Family History  Problem Relation Age of Onset  . Breast cancer Sister   . Breast cancer Maternal Aunt   . Breast cancer Maternal Aunt      Review of Systems  Respiratory: Positive for shortness of breath.   Musculoskeletal: Positive for arthralgias.  All other systems reviewed and are negative.   Objective:  Physical Exam Constitutional:      Appearance: She is obese.   HENT:     Head: Normocephalic.  Eyes:     Pupils: Pupils are equal, round, and reactive to light.  Cardiovascular:     Rate and Rhythm: Regular rhythm.     Pulses: Normal pulses.     Heart sounds: Normal heart sounds.  Pulmonary:     Breath sounds: Normal breath sounds.  Abdominal:     Palpations: Abdomen is soft.     Tenderness: There is no abdominal tenderness.  Genitourinary:    Comments: Deferred Musculoskeletal:     Cervical back: Normal range of motion.     Comments: Examination of the right knee reveals a healed lateral incision. She has a valgus deformity. She has got some swelling, trace effusion. No warmth or erythema. Range of motion of the right knee is 10-100 without instability. Range of motion of the contralateral knee is 0-130. Painless range of motion of the hips.  Skin:    General: Skin is warm and dry.  Neurological:     Mental Status: She is alert and oriented to person, place, and time.  Psychiatric:        Mood and Affect: Mood normal.     Vital signs in last 24 hours: @VSRANGES @  Labs:   Estimated body mass index is 44.91 kg/m as calculated from the following:   Height as of an earlier encounter on 12/29/19: 5\' 2"  (1.575 m).   Weight as of an earlier encounter on 12/29/19: 111.4 kg.   Imaging Review Plain radiographs demonstrate severe degenerative joint disease of the right knee(s). The overall alignment issignificant valgus. The bone quality appears to be adequate for age and reported activity level.      Assessment/Plan:  End stage arthritis, right knee   The patient history, physical examination, clinical judgment of the provider and imaging studies are consistent with end stage degenerative joint disease of the right knee(s) and total knee arthroplasty is deemed medically necessary. The treatment options including medical management, injection therapy arthroscopy and arthroplasty were discussed at length. The risks and benefits of  total knee arthroplasty were presented and reviewed. The risks due to aseptic loosening, infection, stiffness, patella tracking problems, thromboembolic complications and other imponderables were discussed. The patient acknowledged the explanation, agreed to proceed with the plan and consent was signed. Patient is being admitted for inpatient treatment for surgery, pain control, PT, OT, prophylactic antibiotics, VTE prophylaxis, progressive ambulation and ADL's and discharge planning. The patient is planning to be discharged home.     Patient's anticipated LOS is less than 2 midnights, meeting these requirements: - Younger than 74 - Lives within 1 hour of care - Has a competent adult at home to recover with post-op recover - NO history of  - Chronic pain requiring opiods  - Diabetes  - Coronary Artery Disease  -  Heart failure  - Heart attack  - Stroke  - DVT/VTE  - Cardiac arrhythmia  - Respiratory Failure/COPD  - Renal failure  - Anemia  - Advanced Liver disease

## 2019-12-30 NOTE — Progress Notes (Signed)
Pt states to stop Plavix and Ibuprofen 5 days prior to surgery date. Pts last doses to be on Thursday 12/31/2019. Pt also stated she was told per MD office that she has to loose 10 lbs prior to surgery day.

## 2019-12-31 DIAGNOSIS — D23121 Other benign neoplasm of skin of left upper eyelid, including canthus: Secondary | ICD-10-CM | POA: Diagnosis not present

## 2019-12-31 DIAGNOSIS — D2310 Other benign neoplasm of skin of unspecified eyelid, including canthus: Secondary | ICD-10-CM | POA: Diagnosis not present

## 2019-12-31 NOTE — Progress Notes (Signed)
Left a voice message for patient to come for her covid test on Sat 10/16, instead of Fri 10/15, as she was scheduled too early. Wednesday surgeries are covid tested on Saturdays. If anyone has any questions on which days and times to schedule covid test, please call [phone] to avoid these errors. This appointment will be moved.

## 2019-12-31 NOTE — Progress Notes (Signed)
The covid test has been rescheduled for Sat 01/02/20 at 12:10pm. We are at the same location [address] from 9:00am-12:30pm.

## 2020-01-01 ENCOUNTER — Inpatient Hospital Stay (HOSPITAL_COMMUNITY)
Admission: RE | Admit: 2020-01-01 | Discharge: 2020-01-01 | Disposition: A | Discharge status: Home / Self Care | Payer: BC Managed Care – PPO | Source: Ambulatory Visit

## 2020-01-02 ENCOUNTER — Other Ambulatory Visit (HOSPITAL_COMMUNITY)
Admission: RE | Admit: 2020-01-02 | Discharge: 2020-01-02 | Disposition: A | Discharge status: Home / Self Care | Payer: BC Managed Care – PPO | Source: Ambulatory Visit | Attending: Orthopedic Surgery | Admitting: Orthopedic Surgery

## 2020-01-02 DIAGNOSIS — Z01812 Encounter for preprocedural laboratory examination: Secondary | ICD-10-CM | POA: Diagnosis not present

## 2020-01-02 DIAGNOSIS — Z20822 Contact with and (suspected) exposure to covid-19: Secondary | ICD-10-CM | POA: Insufficient documentation

## 2020-01-02 LAB — SARS CORONAVIRUS 2 (TAT 6-24 HRS): SARS Coronavirus 2: NEGATIVE

## 2020-01-05 NOTE — Anesthesia Preprocedure Evaluation (Addendum)
Anesthesia Evaluation  Patient identified by MRN, date of birth, ID band Patient awake    Reviewed: Allergy & Precautions, NPO status , Patient's Chart, lab work & pertinent test results  History of Anesthesia Complications (+) PONV and history of anesthetic complications (Post tonsillectomy )  Airway Mallampati: II  TM Distance: >3 FB Neck ROM: Full    Dental no notable dental hx.    Pulmonary neg pulmonary ROS,    Pulmonary exam normal breath sounds clear to auscultation       Cardiovascular hypertension, Pt. on medications Normal cardiovascular exam Rhythm:Regular Rate:Normal     Neuro/Psych TIAnegative psych ROS   GI/Hepatic negative GI ROS, Neg liver ROS,   Endo/Other  Hypothyroidism Morbid obesity  Renal/GU negative Renal ROS     Musculoskeletal  (+) Arthritis ,   Abdominal (+) + obese,   Peds  Hematology HLD   Anesthesia Other Findings Degenerative joint disease right knee  Reproductive/Obstetrics                            Anesthesia Physical Anesthesia Plan  ASA: III  Anesthesia Plan: Spinal and Regional   Post-op Pain Management:    Induction: Intravenous  PONV Risk Score and Plan: 3 and Ondansetron, Dexamethasone, Midazolam and Treatment may vary due to age or medical condition  Airway Management Planned: Simple Face Mask  Additional Equipment:   Intra-op Plan:   Post-operative Plan:   Informed Consent: I have reviewed the patients History and Physical, chart, labs and discussed the procedure including the risks, benefits and alternatives for the proposed anesthesia with the patient or authorized representative who has indicated his/her understanding and acceptance.     Dental advisory given  Plan Discussed with: CRNA  Anesthesia Plan Comments:        Anesthesia Quick Evaluation

## 2020-01-06 ENCOUNTER — Ambulatory Visit (HOSPITAL_COMMUNITY): Payer: BC Managed Care – PPO | Admitting: Anesthesiology

## 2020-01-06 ENCOUNTER — Encounter (HOSPITAL_COMMUNITY): Payer: Self-pay | Admitting: Orthopedic Surgery

## 2020-01-06 ENCOUNTER — Ambulatory Visit (HOSPITAL_COMMUNITY): Payer: BC Managed Care – PPO | Admitting: Physician Assistant

## 2020-01-06 ENCOUNTER — Ambulatory Visit (HOSPITAL_COMMUNITY): Payer: BC Managed Care – PPO

## 2020-01-06 ENCOUNTER — Observation Stay (HOSPITAL_COMMUNITY)
Admission: RE | Admit: 2020-01-06 | Discharge: 2020-01-07 | Disposition: A | Discharge status: Home / Self Care | Payer: BC Managed Care – PPO | Source: Other Acute Inpatient Hospital | Attending: Orthopedic Surgery | Admitting: Orthopedic Surgery

## 2020-01-06 ENCOUNTER — Encounter (HOSPITAL_COMMUNITY)
Admission: RE | Disposition: A | Discharge status: Home / Self Care | Payer: Self-pay | Source: Other Acute Inpatient Hospital | Attending: Orthopedic Surgery

## 2020-01-06 DIAGNOSIS — Z7722 Contact with and (suspected) exposure to environmental tobacco smoke (acute) (chronic): Secondary | ICD-10-CM | POA: Insufficient documentation

## 2020-01-06 DIAGNOSIS — Z7901 Long term (current) use of anticoagulants: Secondary | ICD-10-CM | POA: Insufficient documentation

## 2020-01-06 DIAGNOSIS — Z471 Aftercare following joint replacement surgery: Secondary | ICD-10-CM | POA: Diagnosis not present

## 2020-01-06 DIAGNOSIS — M1711 Unilateral primary osteoarthritis, right knee: Secondary | ICD-10-CM | POA: Diagnosis not present

## 2020-01-06 DIAGNOSIS — G8918 Other acute postprocedural pain: Secondary | ICD-10-CM | POA: Diagnosis not present

## 2020-01-06 DIAGNOSIS — Z96651 Presence of right artificial knee joint: Secondary | ICD-10-CM | POA: Diagnosis not present

## 2020-01-06 DIAGNOSIS — I10 Essential (primary) hypertension: Secondary | ICD-10-CM | POA: Diagnosis not present

## 2020-01-06 DIAGNOSIS — E785 Hyperlipidemia, unspecified: Secondary | ICD-10-CM | POA: Diagnosis not present

## 2020-01-06 DIAGNOSIS — M25561 Pain in right knee: Secondary | ICD-10-CM | POA: Diagnosis not present

## 2020-01-06 DIAGNOSIS — Z79899 Other long term (current) drug therapy: Secondary | ICD-10-CM | POA: Insufficient documentation

## 2020-01-06 HISTORY — PX: KNEE ARTHROPLASTY: SHX992

## 2020-01-06 LAB — TYPE AND SCREEN
ABO/RH(D): A POS
Antibody Screen: NEGATIVE

## 2020-01-06 SURGERY — ARTHROPLASTY, KNEE, TOTAL, USING IMAGELESS COMPUTER-ASSISTED NAVIGATION
Anesthesia: Regional | Site: Knee | Laterality: Right

## 2020-01-06 MED ORDER — LIDOCAINE 2% (20 MG/ML) 5 ML SYRINGE
INTRAMUSCULAR | Status: DC | PRN
  Administered 2020-01-06: 1.5 mg/kg/h via INTRAVENOUS

## 2020-01-06 MED ORDER — CEFAZOLIN SODIUM-DEXTROSE 2-4 GM/100ML-% IV SOLN
2.0000 g | INTRAVENOUS | Status: AC
  Administered 2020-01-06: 2 g via INTRAVENOUS
  Filled 2020-01-06: qty 100

## 2020-01-06 MED ORDER — ACETAMINOPHEN 500 MG PO TABS: 1000.0000 mg | ORAL_TABLET | Freq: Once | ORAL | Status: DC

## 2020-01-06 MED ORDER — SODIUM CHLORIDE (PF) 0.9 % IJ SOLN
INTRAMUSCULAR | Status: AC
  Filled 2020-01-06: qty 50

## 2020-01-06 MED ORDER — ACETAMINOPHEN 10 MG/ML IV SOLN
1000.0000 mg | Freq: Once | INTRAVENOUS | Status: AC
  Administered 2020-01-06: 1000 mg via INTRAVENOUS
  Filled 2020-01-06: qty 100

## 2020-01-06 MED ORDER — FENTANYL CITRATE (PF) 100 MCG/2ML IJ SOLN
INTRAMUSCULAR | Status: AC
  Filled 2020-01-06: qty 2

## 2020-01-06 MED ORDER — STERILE WATER FOR IRRIGATION IR SOLN
Status: DC | PRN
  Administered 2020-01-06: 2000 mL

## 2020-01-06 MED ORDER — METHOCARBAMOL 500 MG PO TABS
500.0000 mg | ORAL_TABLET | Freq: Four times a day (QID) | ORAL | Status: DC | PRN
  Administered 2020-01-07: 500 mg via ORAL
  Filled 2020-01-06: qty 1

## 2020-01-06 MED ORDER — CEFAZOLIN SODIUM-DEXTROSE 2-4 GM/100ML-% IV SOLN
INTRAVENOUS | Status: AC
  Filled 2020-01-06: qty 100

## 2020-01-06 MED ORDER — ACETAMINOPHEN 325 MG PO TABS
325.0000 mg | ORAL_TABLET | Freq: Four times a day (QID) | ORAL | Status: DC | PRN
  Administered 2020-01-06: 650 mg via ORAL
  Filled 2020-01-06: qty 2

## 2020-01-06 MED ORDER — LIDOCAINE 2% (20 MG/ML) 5 ML SYRINGE
INTRAMUSCULAR | Status: AC
  Filled 2020-01-06: qty 5

## 2020-01-06 MED ORDER — CHLORHEXIDINE GLUCONATE 0.12 % MT SOLN
15.0000 mL | Freq: Once | OROMUCOSAL | Status: AC
  Administered 2020-01-06: 15 mL via OROMUCOSAL

## 2020-01-06 MED ORDER — KETOROLAC TROMETHAMINE 30 MG/ML IJ SOLN
INTRAMUSCULAR | Status: DC | PRN
  Administered 2020-01-06: 30 mg

## 2020-01-06 MED ORDER — KETOROLAC TROMETHAMINE 15 MG/ML IJ SOLN
INTRAMUSCULAR | Status: AC
  Filled 2020-01-06: qty 1

## 2020-01-06 MED ORDER — DOCUSATE SODIUM 100 MG PO CAPS: 100.0000 mg | ORAL_CAPSULE | Freq: Two times a day (BID) | ORAL | 1 refills | Status: AC

## 2020-01-06 MED ORDER — SODIUM CHLORIDE 0.9 % IV SOLN: INTRAVENOUS | Status: DC

## 2020-01-06 MED ORDER — PROPOFOL 10 MG/ML IV BOLUS
INTRAVENOUS | Status: AC
  Filled 2020-01-06: qty 20

## 2020-01-06 MED ORDER — POVIDONE-IODINE 10 % EX SWAB: 2.0000 "application " | Freq: Once | CUTANEOUS | Status: DC

## 2020-01-06 MED ORDER — EPHEDRINE 5 MG/ML INJ
INTRAVENOUS | Status: AC
  Filled 2020-01-06: qty 10

## 2020-01-06 MED ORDER — SODIUM CHLORIDE 0.9 % IV SOLN
Freq: Once | INTRAVENOUS | Status: AC
  Administered 2020-01-06: 1000 mL via INTRAVENOUS

## 2020-01-06 MED ORDER — ROPIVACAINE HCL 5 MG/ML IJ SOLN
INTRAMUSCULAR | Status: DC | PRN
  Administered 2020-01-06: 30 mL via PERINEURAL

## 2020-01-06 MED ORDER — HYDROMORPHONE HCL 1 MG/ML IJ SOLN: 0.5000 mg | INTRAMUSCULAR | Status: DC | PRN

## 2020-01-06 MED ORDER — ONDANSETRON HCL 4 MG/2ML IJ SOLN: 4.0000 mg | Freq: Four times a day (QID) | INTRAMUSCULAR | Status: DC | PRN

## 2020-01-06 MED ORDER — SODIUM CHLORIDE 0.9 % IR SOLN
Status: DC | PRN
  Administered 2020-01-06: 1000 mL

## 2020-01-06 MED ORDER — HYDROMORPHONE HCL 1 MG/ML IJ SOLN: 0.2500 mg | INTRAMUSCULAR | Status: DC | PRN

## 2020-01-06 MED ORDER — ISOPROPYL ALCOHOL 70 % SOLN
Status: DC | PRN
  Administered 2020-01-06: 1 via TOPICAL

## 2020-01-06 MED ORDER — ORAL CARE MOUTH RINSE: 15.0000 mL | Freq: Once | OROMUCOSAL | Status: AC

## 2020-01-06 MED ORDER — ONDANSETRON HCL 4 MG PO TABS: 4.0000 mg | ORAL_TABLET | Freq: Three times a day (TID) | ORAL | 0 refills | Status: DC | PRN

## 2020-01-06 MED ORDER — SENNA 8.6 MG PO TABS: 2.0000 | ORAL_TABLET | Freq: Every day | ORAL | 1 refills | Status: AC

## 2020-01-06 MED ORDER — BUPIVACAINE HCL (PF) 0.25 % IJ SOLN
INTRAMUSCULAR | Status: DC | PRN
  Administered 2020-01-06: 30 mL

## 2020-01-06 MED ORDER — OXYCODONE HCL 5 MG/5ML PO SOLN: 5.0000 mg | Freq: Once | ORAL | Status: DC | PRN

## 2020-01-06 MED ORDER — SODIUM CHLORIDE 0.9 % IR SOLN
Status: DC | PRN
  Administered 2020-01-06: 3000 mL

## 2020-01-06 MED ORDER — METHOCARBAMOL 500 MG IVPB - SIMPLE MED
500.0000 mg | Freq: Four times a day (QID) | INTRAVENOUS | Status: DC | PRN
  Administered 2020-01-06: 500 mg via INTRAVENOUS
  Filled 2020-01-06: qty 50

## 2020-01-06 MED ORDER — KETAMINE HCL 10 MG/ML IJ SOLN
INTRAMUSCULAR | Status: DC | PRN
  Administered 2020-01-06: 20 mg via INTRAVENOUS
  Administered 2020-01-06: 10 mg via INTRAVENOUS

## 2020-01-06 MED ORDER — OXYCODONE HCL 5 MG PO TABS: 5.0000 mg | ORAL_TABLET | Freq: Once | ORAL | Status: DC | PRN

## 2020-01-06 MED ORDER — PROPOFOL 1000 MG/100ML IV EMUL
INTRAVENOUS | Status: AC
  Filled 2020-01-06: qty 100

## 2020-01-06 MED ORDER — PHENYLEPHRINE HCL-NACL 10-0.9 MG/250ML-% IV SOLN
INTRAVENOUS | Status: DC | PRN
  Administered 2020-01-06: 50 ug/min via INTRAVENOUS

## 2020-01-06 MED ORDER — CEFAZOLIN SODIUM-DEXTROSE 2-4 GM/100ML-% IV SOLN
2.0000 g | Freq: Four times a day (QID) | INTRAVENOUS | Status: AC
  Administered 2020-01-06 (×2): 2 g via INTRAVENOUS

## 2020-01-06 MED ORDER — LIDOCAINE 2% (20 MG/ML) 5 ML SYRINGE
INTRAMUSCULAR | Status: AC
  Filled 2020-01-06: qty 10

## 2020-01-06 MED ORDER — KETOROLAC TROMETHAMINE 30 MG/ML IJ SOLN
INTRAMUSCULAR | Status: AC
  Filled 2020-01-06: qty 1

## 2020-01-06 MED ORDER — PROMETHAZINE HCL 25 MG/ML IJ SOLN: 6.2500 mg | INTRAMUSCULAR | Status: DC | PRN

## 2020-01-06 MED ORDER — PROPOFOL 500 MG/50ML IV EMUL
INTRAVENOUS | Status: DC | PRN
  Administered 2020-01-06: 100 ug/kg/min via INTRAVENOUS

## 2020-01-06 MED ORDER — TRANEXAMIC ACID-NACL 1000-0.7 MG/100ML-% IV SOLN
1000.0000 mg | INTRAVENOUS | Status: AC
  Administered 2020-01-06: 1000 mg via INTRAVENOUS
  Filled 2020-01-06: qty 100

## 2020-01-06 MED ORDER — METOCLOPRAMIDE HCL 5 MG/ML IJ SOLN: 5.0000 mg | Freq: Three times a day (TID) | INTRAMUSCULAR | Status: DC | PRN

## 2020-01-06 MED ORDER — METHOCARBAMOL 500 MG IVPB - SIMPLE MED
INTRAVENOUS | Status: AC
  Filled 2020-01-06: qty 50

## 2020-01-06 MED ORDER — BUPIVACAINE HCL (PF) 0.5 % IJ SOLN
INTRAMUSCULAR | Status: DC | PRN
  Administered 2020-01-06: 3 mL via INTRATHECAL

## 2020-01-06 MED ORDER — MIDAZOLAM HCL 5 MG/5ML IJ SOLN
INTRAMUSCULAR | Status: DC | PRN
  Administered 2020-01-06: 2 mg via INTRAVENOUS

## 2020-01-06 MED ORDER — EPHEDRINE SULFATE-NACL 50-0.9 MG/10ML-% IV SOSY
PREFILLED_SYRINGE | INTRAVENOUS | Status: DC | PRN
  Administered 2020-01-06: 10 mg via INTRAVENOUS

## 2020-01-06 MED ORDER — MIDAZOLAM HCL 2 MG/2ML IJ SOLN
INTRAMUSCULAR | Status: AC
  Filled 2020-01-06: qty 2

## 2020-01-06 MED ORDER — METOCLOPRAMIDE HCL 5 MG PO TABS
5.0000 mg | ORAL_TABLET | Freq: Three times a day (TID) | ORAL | Status: DC | PRN
  Filled 2020-01-06: qty 2

## 2020-01-06 MED ORDER — KETAMINE HCL 10 MG/ML IJ SOLN
INTRAMUSCULAR | Status: AC
  Filled 2020-01-06: qty 1

## 2020-01-06 MED ORDER — KETOROLAC TROMETHAMINE 15 MG/ML IJ SOLN
15.0000 mg | Freq: Four times a day (QID) | INTRAMUSCULAR | Status: AC
  Administered 2020-01-06 – 2020-01-07 (×4): 15 mg via INTRAVENOUS
  Filled 2020-01-06 (×3): qty 1

## 2020-01-06 MED ORDER — 0.9 % SODIUM CHLORIDE (POUR BTL) OPTIME
TOPICAL | Status: DC | PRN
  Administered 2020-01-06: 1000 mL

## 2020-01-06 MED ORDER — SODIUM CHLORIDE 0.9% FLUSH
INTRAVENOUS | Status: DC | PRN
  Administered 2020-01-06: 30 mL

## 2020-01-06 MED ORDER — OXYCODONE HCL 5 MG PO TABS: 10.0000 mg | ORAL_TABLET | ORAL | Status: DC | PRN

## 2020-01-06 MED ORDER — OXYCODONE HCL 5 MG PO TABS
5.0000 mg | ORAL_TABLET | ORAL | 0 refills | Status: DC | PRN
Start: 2020-01-06 — End: 2020-12-08

## 2020-01-06 MED ORDER — PHENYLEPHRINE HCL (PRESSORS) 10 MG/ML IV SOLN
INTRAVENOUS | Status: AC
  Filled 2020-01-06: qty 1

## 2020-01-06 MED ORDER — BUPIVACAINE HCL 0.25 % IJ SOLN
INTRAMUSCULAR | Status: AC
  Filled 2020-01-06: qty 1

## 2020-01-06 MED ORDER — PHENYLEPHRINE 40 MCG/ML (10ML) SYRINGE FOR IV PUSH (FOR BLOOD PRESSURE SUPPORT)
PREFILLED_SYRINGE | INTRAVENOUS | Status: AC
  Filled 2020-01-06: qty 10

## 2020-01-06 MED ORDER — ONDANSETRON HCL 4 MG PO TABS
4.0000 mg | ORAL_TABLET | Freq: Four times a day (QID) | ORAL | Status: DC | PRN
  Administered 2020-01-06 – 2020-01-07 (×2): 4 mg via ORAL
  Filled 2020-01-06 (×3): qty 1

## 2020-01-06 MED ORDER — LACTATED RINGERS IV SOLN: INTRAVENOUS | Status: DC

## 2020-01-06 MED ORDER — POVIDONE-IODINE 10 % EX SWAB
2.0000 "application " | Freq: Once | CUTANEOUS | Status: AC
  Administered 2020-01-06: 2 via TOPICAL

## 2020-01-06 MED ORDER — PHENYLEPHRINE 40 MCG/ML (10ML) SYRINGE FOR IV PUSH (FOR BLOOD PRESSURE SUPPORT)
PREFILLED_SYRINGE | INTRAVENOUS | Status: DC | PRN
  Administered 2020-01-06 (×2): 80 ug via INTRAVENOUS
  Administered 2020-01-06: 120 ug via INTRAVENOUS
  Administered 2020-01-06 (×2): 80 ug via INTRAVENOUS

## 2020-01-06 MED ORDER — OXYCODONE HCL 5 MG PO TABS
5.0000 mg | ORAL_TABLET | ORAL | Status: DC | PRN
  Administered 2020-01-06 – 2020-01-07 (×3): 5 mg via ORAL
  Filled 2020-01-06: qty 2
  Filled 2020-01-06 (×2): qty 1

## 2020-01-06 MED ORDER — ASPIRIN 81 MG PO CHEW: 81.0000 mg | CHEWABLE_TABLET | Freq: Two times a day (BID) | ORAL | 0 refills | Status: AC

## 2020-01-06 SURGICAL SUPPLY — 74 items
BAG ZIPLOCK 12X15 (MISCELLANEOUS) IMPLANT
BATTERY INSTRU NAVIGATION (MISCELLANEOUS) ×6 IMPLANT
BLADE SAW RECIPROCATING 77.5 (BLADE) ×2 IMPLANT
BNDG CONFORM 6X.82 1P STRL (GAUZE/BANDAGES/DRESSINGS) ×2 IMPLANT
BNDG ELASTIC 4X5.8 VLCR STR LF (GAUZE/BANDAGES/DRESSINGS) ×2 IMPLANT
BNDG ELASTIC 6X10 VLCR STRL LF (GAUZE/BANDAGES/DRESSINGS) ×2 IMPLANT
BNDG ELASTIC 6X5.8 VLCR STR LF (GAUZE/BANDAGES/DRESSINGS) ×2 IMPLANT
CHLORAPREP W/TINT 26 (MISCELLANEOUS) ×4 IMPLANT
COMPONENT TRI CR RETAIN SZ6 RT (Miscellaneous) ×1 IMPLANT
COVER SURGICAL LIGHT HANDLE (MISCELLANEOUS) ×2 IMPLANT
COVER WAND RF STERILE (DRAPES) IMPLANT
CUFF TOURN SGL QUICK 34 (TOURNIQUET CUFF) ×2
CUFF TRNQT CYL 34X4.125X (TOURNIQUET CUFF) ×1 IMPLANT
DECANTER SPIKE VIAL GLASS SM (MISCELLANEOUS) ×4 IMPLANT
DERMABOND ADVANCED (GAUZE/BANDAGES/DRESSINGS) ×1
DERMABOND ADVANCED .7 DNX12 (GAUZE/BANDAGES/DRESSINGS) ×1 IMPLANT
DRAPE SHEET LG 3/4 BI-LAMINATE (DRAPES) ×6 IMPLANT
DRAPE U-SHAPE 47X51 STRL (DRAPES) ×2 IMPLANT
DRSG AQUACEL AG ADV 3.5X10 (GAUZE/BANDAGES/DRESSINGS) ×2 IMPLANT
DRSG TEGADERM 4X4.75 (GAUZE/BANDAGES/DRESSINGS) IMPLANT
ELECT BLADE TIP CTD 4 INCH (ELECTRODE) ×4 IMPLANT
ELECT REM PT RETURN 15FT ADLT (MISCELLANEOUS) ×2 IMPLANT
EVACUATOR 1/8 PVC DRAIN (DRAIN) IMPLANT
GAUZE SPONGE 4X4 12PLY STRL (GAUZE/BANDAGES/DRESSINGS) ×2 IMPLANT
GLOVE BIO SURGEON STRL SZ8.5 (GLOVE) ×4 IMPLANT
GLOVE BIOGEL M STRL SZ7.5 (GLOVE) ×6 IMPLANT
GLOVE BIOGEL PI IND STRL 8 (GLOVE) ×2 IMPLANT
GLOVE BIOGEL PI IND STRL 8.5 (GLOVE) ×1 IMPLANT
GLOVE BIOGEL PI INDICATOR 8 (GLOVE) ×2
GLOVE BIOGEL PI INDICATOR 8.5 (GLOVE) ×1
GOWN SPEC L3 XXLG W/TWL (GOWN DISPOSABLE) ×2 IMPLANT
GOWN SPEC L4 XLG W/TWL (GOWN DISPOSABLE) ×2 IMPLANT
HANDPIECE INTERPULSE COAX TIP (DISPOSABLE) ×2
HOLDER FOLEY CATH W/STRAP (MISCELLANEOUS) ×2 IMPLANT
HOOD PEEL AWAY FLYTE STAYCOOL (MISCELLANEOUS) ×6 IMPLANT
INSERT TIB BEARING X3 9 SZ5 (Insert) ×2 IMPLANT
JET LAVAGE IRRISEPT WOUND (IRRIGATION / IRRIGATOR) ×2
KIT TURNOVER KIT A (KITS) IMPLANT
KNEE PATELLA ASYMMETRIC 10X35 (Knees) ×2 IMPLANT
KNEE TIBIAL COMPONENT SZ5 (Knees) ×2 IMPLANT
LAVAGE JET IRRISEPT WOUND (IRRIGATION / IRRIGATOR) ×1 IMPLANT
MARKER SKIN DUAL TIP RULER LAB (MISCELLANEOUS) ×2 IMPLANT
NDL SAFETY ECLIPSE 18X1.5 (NEEDLE) ×1 IMPLANT
NEEDLE HYPO 18GX1.5 SHARP (NEEDLE) ×2
NEEDLE SPNL 18GX3.5 QUINCKE PK (NEEDLE) ×2 IMPLANT
NS IRRIG 1000ML POUR BTL (IV SOLUTION) ×2 IMPLANT
PACK TOTAL KNEE CUSTOM (KITS) ×2 IMPLANT
PADDING CAST COTTON 6X4 STRL (CAST SUPPLIES) ×2 IMPLANT
PENCIL SMOKE EVACUATOR (MISCELLANEOUS) IMPLANT
PIN FLUTED HEDLESS FIX 3.5X1/8 (PIN) ×2 IMPLANT
PROTECTOR NERVE ULNAR (MISCELLANEOUS) ×2 IMPLANT
SAW OSC TIP CART 19.5X105X1.3 (SAW) ×2 IMPLANT
SEALER BIPOLAR AQUA 6.0 (INSTRUMENTS) ×2 IMPLANT
SET HNDPC FAN SPRY TIP SCT (DISPOSABLE) ×1 IMPLANT
SET PAD KNEE POSITIONER (MISCELLANEOUS) ×2 IMPLANT
SPONGE DRAIN TRACH 4X4 STRL 2S (GAUZE/BANDAGES/DRESSINGS) IMPLANT
SUT MNCRL AB 3-0 PS2 18 (SUTURE) ×2 IMPLANT
SUT MNCRL AB 4-0 PS2 18 (SUTURE) ×2 IMPLANT
SUT MON AB 2-0 CT1 36 (SUTURE) ×2 IMPLANT
SUT STRATAFIX PDO 1 14 VIOLET (SUTURE) ×2
SUT STRATFX PDO 1 14 VIOLET (SUTURE) ×1
SUT VIC AB 1 CTX 36 (SUTURE) ×4
SUT VIC AB 1 CTX36XBRD ANBCTR (SUTURE) ×2 IMPLANT
SUT VIC AB 2-0 CT1 27 (SUTURE) ×2
SUT VIC AB 2-0 CT1 TAPERPNT 27 (SUTURE) ×1 IMPLANT
SUTURE STRATFX PDO 1 14 VIOLET (SUTURE) ×1 IMPLANT
SYR 3ML LL SCALE MARK (SYRINGE) ×2 IMPLANT
TOWER CARTRIDGE SMART MIX (DISPOSABLE) IMPLANT
TRAY FOLEY MTR SLVR 16FR STAT (SET/KITS/TRAYS/PACK) IMPLANT
TRIATHLON CRUCIATE RETAIN SZ6 (Miscellaneous) ×2 IMPLANT
TUBE SUCTION HIGH CAP CLEAR NV (SUCTIONS) ×2 IMPLANT
WATER STERILE IRR 1000ML POUR (IV SOLUTION) ×4 IMPLANT
WRAP KNEE MAXI GEL POST OP (GAUZE/BANDAGES/DRESSINGS) ×2 IMPLANT
YANKAUER SUCT BULB TIP 10FT TU (MISCELLANEOUS) ×2 IMPLANT

## 2020-01-06 NOTE — Progress Notes (Signed)
Orthopedic Tech Progress Note Patient Details:  [name] [birth year] [id number]  Ortho Devices Type of Ortho Device: Knee Immobilizer       [place] Pink 01/06/2020, 4:38 PM

## 2020-01-06 NOTE — Anesthesia Procedure Notes (Signed)
Anesthesia Regional Block: Adductor canal block   Pre-Anesthetic Checklist: ,, timeout performed, Correct Patient, Correct Site, Correct Laterality, Correct Procedure,, site marked, risks and benefits discussed, Surgical consent,  Pre-op evaluation,  At surgeon's request and post-op pain management  Laterality: Right  Prep: chloraprep       Needles:  Injection technique: Single-shot  Needle Type: Echogenic Stimulator Needle     Needle Length: 10cm  Needle Gauge: 20     Additional Needles:   Procedures:,,,, ultrasound used (permanent image in chart),,,,  Narrative:  Start time: 01/06/2020 8:00 AM End time: 01/06/2020 8:10 AM Injection made incrementally with aspirations every 5 mL.  Performed by: Personally  Anesthesiologist: Murvin Natal, MD  Additional Notes: Functioning IV was confirmed and monitors were applied. A time-out was performed. Hand hygiene and sterile gloves were used. The thigh was placed in a frog-leg position and prepped in a sterile fashion. A 121mm 20ga BBraun echogenic stimulator needle was placed using ultrasound guidance.  Negative aspiration and negative test dose prior to incremental administration of local anesthetic. The patient tolerated the procedure well.

## 2020-01-06 NOTE — Progress Notes (Signed)
Physical Therapy Treatment Patient Details Name: [name] MRN: [id number] DOB: 02-10-1961 Today's Date: 01/06/2020    History of Present Illness Patient is 59 y.o. female s/p Rt TKA on 01/06/20 with PMH significant for stroke, RBBB, HTN, OA, anemia.    PT Comments    Patient seen for additional follow up therapy session and continues to mobilize for bed mobility, transfers, and gait with RW with min gaurd/supervision. Cues provided for knee precautions when sitting on toilet and pt with good recall for safe hand placement on RW and step pattern during gait. She reported "funny" "light" sensation with gait again and had slight drops in BP throughout mobiltiy. Pt reports ambulation distance into home is much farther than what she was able to walk in single bout today; she was limited this session by increased SOB as well and expressed concerns about making it inside before needing to rest. Given BP and pt's symptoms RN alerted MD and pt will be admitted overnight. She will benefit from additional PT interventions to progress mobility in preparation for safe discharge home. Acute PT will progress as able.   Follow Up Recommendations  Follow surgeon's recommendation for DC plan and follow-up therapies;Outpatient PT     Equipment Recommendations  None recommended by PT    Recommendations for Other Services       Precautions / Restrictions Precautions Precautions: Fall Restrictions Weight Bearing Restrictions: No Other Position/Activity Restrictions: WBAT    Mobility  Bed Mobility Overal bed mobility: Needs Assistance Bed Mobility: Supine to Sit;Sit to Supine     Supine to sit: Supervision;Min guard Sit to supine: Min assist   General bed mobility comments: cues for use of belt to assist Rt LE off and on EOB, light assist needed to bring LE onto bed  Transfers Overall transfer level: Needs assistance Equipment used: Rolling walker (2 wheeled) Transfers: Sit to/from  Stand Sit to Stand: Min guard;Supervision         General transfer comment: cues for hand placement and supervision for rise from EOB. Min guard for sit<>stand from commode with cues for safe use of grab bar and to extend Rt LE to prevent knee flexion. (pt in knee immobilzer) Ming uard for sit<>stands from wheelchair due to seated break secondary to weakness during gait.  Ambulation/Gait Ambulation/Gait assistance: Supervision;Min guard Gait Distance (Feet): 80 Feet (seated break at 72' and then after 20') Assistive device: Rolling walker (2 wheeled) Gait Pattern/deviations: Step-to pattern Gait velocity: decr   General Gait Details: cues for safe step pattern in RW, no overt LOB, pt in Rt knee immobilizer for stability due to poor quad activation. Pt c/o "funny sensation" requried seated rest, BP with slight drop. Pt reproted feeling very winded and SOB. after gait. Pt stood from seated rest and amb to bed continued to report some lightheadedness.       Wheelchair Mobility    Modified Rankin (Stroke Patients Only)       Balance Overall balance assessment: Needs assistance Sitting-balance support: Feet supported Sitting balance-Leahy Scale: Good     Standing balance support: During functional activity;Bilateral upper extremity supported Standing balance-Leahy Scale: Fair Standing balance comment: pt able to wash hands at sink                            Cognition Arousal/Alertness: Awake/alert Behavior During Therapy: WFL for tasks assessed/performed Overall Cognitive Status: Within Functional Limits for tasks assessed  Exercises Total Joint Exercises Ankle Circles/Pumps: AROM;Both;20 reps;Supine    General Comments        Pertinent Vitals/Pain Pain Assessment: No/denies pain    Home Living Family/patient expects to be discharged to:: Private residence Living Arrangements: Spouse/significant  other Available Help at Discharge: Family Type of Home: House Home Access: Ramped entrance   Home Layout: Two level;Full bath on main level;Able to live on main level with bedroom/bathroom Home Equipment: Gilford Rile - 2 wheels;Bedside commode;Grab bars - tub/shower;Kasandra Knudsen - single point Additional Comments: pt lives with husband, he is independent but can offer limited physical assist due to prior injuries of his own.     Prior Function Level of Independence: Independent      Comments: pt works as an Estate manager/land agent (current goals can now be found in the care plan section) Acute Rehab PT Goals Patient Stated Goal: get back to independence PT Goal Formulation: With patient Time For Goal Achievement: 01/13/20 Potential to Achieve Goals: Good Progress towards PT goals: Progressing toward goals    Frequency    7X/week      PT Plan Current plan remains appropriate    Co-evaluation              AM-PAC PT "6 Clicks" Mobility   Outcome Measure  Help needed turning from your back to your side while in a flat bed without using bedrails?: None Help needed moving from lying on your back to sitting on the side of a flat bed without using bedrails?: A Little Help needed moving to and from a bed to a chair (including a wheelchair)?: A Little Help needed standing up from a chair using your arms (e.g., wheelchair or bedside chair)?: A Little Help needed to walk in hospital room?: A Little Help needed climbing 3-5 steps with a railing? : A Little 6 Click Score: 19    End of Session Equipment Utilized During Treatment: Gait belt Activity Tolerance: Patient tolerated treatment well;Treatment limited secondary to medical complications (Comment) (pt with orthostatic hypotension after mobilizing) Patient left: in bed;with call bell/phone within reach Nurse Communication: Mobility status PT Visit Diagnosis: Muscle weakness (generalized) (M62.81);Difficulty in walking, not elsewhere  classified (R26.2)     Time: 4388-8757 PT Time Calculation (min) (ACUTE ONLY): 36 min  Charges:  $Gait Training: 8-22 mins $Therapeutic Activity: 8-22 mins                    Verner Mould, DPT Acute Rehabilitation Services  Office [phone] Pager [phone]  01/06/2020 7:32 PM

## 2020-01-06 NOTE — Interval H&P Note (Signed)
History and Physical Interval Note:  01/06/2020 8:15 AM  [name]  has presented today for surgery, with the diagnosis of Degenerative joint disease right knee.  The various methods of treatment have been discussed with the patient and family. After consideration of risks, benefits and other options for treatment, the patient has consented to  Procedure(s): COMPUTER ASSISTED TOTAL KNEE ARTHROPLASTY (Right) as a surgical intervention.  The patient's history has been reviewed, patient examined, no change in status, stable for surgery.  I have reviewed the patient's chart and labs.  Questions were answered to the patient's satisfaction.     Hilton Cork Teague Goynes

## 2020-01-06 NOTE — Discharge Instructions (Signed)
° °Dr. Adelaida Reindel °Total Joint Specialist °[facility] °[address] °[place], [place] [address] °[phone] ° °TOTAL KNEE REPLACEMENT POSTOPERATIVE DIRECTIONS ° ° ° °Knee Rehabilitation, Guidelines Following Surgery  °Results after knee surgery are often greatly improved when you follow the exercise, range of motion and muscle strengthening exercises prescribed by your doctor. Safety measures are also important to protect the knee from further injury. Any time any of these exercises cause you to have increased pain or swelling in your knee joint, decrease the amount until you are comfortable again and slowly increase them. If you have problems or questions, call your caregiver or physical therapist for advice.  ° °WEIGHT BEARING °Weight bearing as tolerated with assist device (walker, cane, etc) as directed, use it as long as suggested by your surgeon or therapist, typically at least 4-6 weeks. ° °HOME CARE INSTRUCTIONS  °Remove items at home which could result in a fall. This includes throw rugs or furniture in walking pathways.  °Continue medications as instructed at time of discharge. °You may have some home medications which will be placed on hold until you complete the course of blood thinner medication.  °You may start showering once you are discharged home but do not submerge the incision under water. Just pat the incision dry and apply a dry gauze dressing on daily. °Walk with walker as instructed.  °You may resume a sexual relationship in one month or when given the OK by your doctor.  °· Use walker as long as suggested by your caregivers. °· Avoid periods of inactivity such as sitting longer than an hour when not asleep. This helps prevent blood clots.  °You may put full weight on your legs and walk as much as is comfortable.  °You may return to work once you are cleared by your doctor.  °Do not drive a car for 6 weeks or until released by you surgeon.  °· Do not drive  while taking narcotics.  °Wear the elastic stockings for three weeks following surgery during the day but you may remove then at night. °Make sure you keep all of your appointments after your operation with all of your doctors and caregivers. You should call the office at the above phone number and make an appointment for approximately two weeks after the date of your surgery. °Do not remove your surgical dressing. The dressing is waterproof; you may take showers in 3 days, but do not take tub baths or submerge the dressing. °Please pick up a stool softener and laxative for home use as long as you are requiring pain medications. °· ICE to the affected knee every three hours for 30 minutes at a time and then as needed for pain and swelling.  Continue to use ice on the knee for pain and swelling from surgery. You may notice swelling that will progress down to the foot and ankle.  This is normal after surgery.  Elevate the leg when you are not up walking on it.   °It is important for you to complete the blood thinner medication as prescribed by your doctor. °· Continue to use the breathing machine which will help keep your temperature down.  It is common for your temperature to cycle up and down following surgery, especially at night when you are not up moving around and exerting yourself.  The breathing machine keeps your lungs expanded and your temperature down. ° °RANGE OF MOTION AND STRENGTHENING EXERCISES  °Rehabilitation of the knee is important following   a knee injury or an operation. After just a few days of immobilization, the muscles of the thigh which control the knee become weakened and shrink (atrophy). Knee exercises are designed to build up the tone and strength of the thigh muscles and to improve knee motion. Often times heat used for twenty to thirty minutes before working out will loosen up your tissues and help with improving the range of motion but do not use heat for the first two weeks following  surgery. These exercises can be done on a training (exercise) mat, on the floor, on a table or on a bed. Use what ever works the best and is most comfortable for you Knee exercises include:  °Leg Lifts - While your knee is still immobilized in a splint or cast, you can do straight leg raises. Lift the leg to 60 degrees, hold for 3 sec, and slowly lower the leg. Repeat 10-20 times 2-3 times daily. Perform this exercise against resistance later as your knee gets better.  °Quad and Hamstring Sets - Tighten up the muscle on the front of the thigh (Quad) and hold for 5-10 sec. Repeat this 10-20 times hourly. Hamstring sets are done by pushing the foot backward against an object and holding for 5-10 sec. Repeat as with quad sets.  °A rehabilitation program following serious knee injuries can speed recovery and prevent re-injury in the future due to weakened muscles. Contact your doctor or a physical therapist for more information on knee rehabilitation.  ° °SKILLED REHAB INSTRUCTIONS: °If the patient is transferred to a skilled rehab facility following release from the hospital, a list of the current medications will be sent to the facility for the patient to continue.  When discharged from the skilled rehab facility, please have the facility set up the patient's Home Health Physical Therapy prior to being released. Also, the skilled facility will be responsible for providing the patient with their medications at time of release from the facility to include their pain medication, the muscle relaxants, and their blood thinner medication. If the patient is still at the rehab facility at time of the two week follow up appointment, the skilled rehab facility will also need to assist the patient in arranging follow up appointment in our office and any transportation needs. ° °MAKE SURE YOU:  °Understand these instructions.  °Will watch your condition.  °Will get help right away if you are not doing well or get worse.   ° ° °Pick up stool softner and laxative for home use following surgery while on pain medications. °Do NOT remove your dressing. You may shower.  °Do not take tub baths or submerge incision under water. °May shower starting three days after surgery. °Please use a clean towel to pat the incision dry following showers. °Continue to use ice for pain and swelling after surgery. °Do not use any lotions or creams on the incision until instructed by your surgeon. ° °

## 2020-01-06 NOTE — Anesthesia Postprocedure Evaluation (Signed)
Anesthesia Post Note  Patient: [name]  Procedure(s) Performed: COMPUTER ASSISTED TOTAL KNEE ARTHROPLASTY (Right Knee)     Patient location during evaluation: PACU Anesthesia Type: Regional and Spinal Level of consciousness: oriented and awake and alert Pain management: pain level controlled Vital Signs Assessment: post-procedure vital signs reviewed and stable Respiratory status: spontaneous breathing, respiratory function stable and patient connected to nasal cannula oxygen Cardiovascular status: blood pressure returned to baseline and stable Postop Assessment: no headache, no backache, no apparent nausea or vomiting and spinal receding Anesthetic complications: no   No complications documented.  Last Vitals:  Vitals:   01/06/20 1342 01/06/20 1400  BP: 99/68 111/76  Pulse: 71 72  Resp: 15 18  Temp: 36.7 C   SpO2: 97% 98%    Last Pain:  Vitals:   01/06/20 1342  TempSrc:   PainSc: 0-No pain                 Latif Nazareno P Burris Matherne

## 2020-01-06 NOTE — Anesthesia Procedure Notes (Signed)
Procedure Name: MAC Date/Time: 01/06/2020 8:35 AM Performed by: Lieutenant Diego, CRNA Pre-anesthesia Checklist: Patient identified, Emergency Drugs available, Suction available, Patient being monitored and Timeout performed Patient Re-evaluated:Patient Re-evaluated prior to induction Oxygen Delivery Method: Simple face mask Preoxygenation: Pre-oxygenation with 100% oxygen Induction Type: IV induction

## 2020-01-06 NOTE — Progress Notes (Signed)
PACU note:  Patient working with PT again and felt weak and short of breath, BP came down to 99/75 from 127/78 prior to standing. Patient worried she will not be able to make it into her home when she gets home d/t weakness and breathing. She has a very long walk to get into her house and husband is the only one at home to help.   Dr. Lyla Glassing called and made aware. Orders received for patient to stay the night and go home in the morning after working with PT. Will continue to monitor and update family.   Eugene Garnet, RN

## 2020-01-06 NOTE — Anesthesia Procedure Notes (Signed)
Spinal  Patient location during procedure: OR Start time: 01/06/2020 8:30 AM End time: 01/06/2020 8:35 AM Staffing Performed: anesthesiologist  Anesthesiologist: Murvin Natal, MD Preanesthetic Checklist Completed: patient identified, IV checked, risks and benefits discussed, surgical consent, monitors and equipment checked, pre-op evaluation and timeout performed Spinal Block Patient position: sitting Prep: DuraPrep Patient monitoring: cardiac monitor, continuous pulse ox and blood pressure Approach: midline Location: L4-5 Injection technique: single-shot Needle Needle type: Pencan  Needle gauge: 24 G Needle length: 9 cm Assessment Sensory level: T10 Additional Notes Functioning IV was confirmed and monitors were applied. Sterile prep and drape, including hand hygiene and sterile gloves were used. The patient was positioned and the spine was prepped. The skin was anesthetized with lidocaine.  Free flow of clear CSF was obtained prior to injecting local anesthetic into the CSF.  The spinal needle aspirated freely following injection.  The needle was carefully withdrawn.  The patient tolerated the procedure well.

## 2020-01-06 NOTE — Progress Notes (Signed)
PACU Phase 2 note: Patient working with PT after knee replacement and became diaphoretic & hypotensive, BP 73/53. Patient's BP recovered to 112/66 after laying supine for a few minutes. Called Dr. Lyla Glassing and received orders for 1L NS bolus and retry in 1 hour. Physical therapy team aware and will come back to work with patient after fluid bolus is completed.  Eugene Garnet, RN

## 2020-01-06 NOTE — Transfer of Care (Signed)
Immediate Anesthesia Transfer of Care Note  Patient: [name]  Procedure(s) Performed: COMPUTER ASSISTED TOTAL KNEE ARTHROPLASTY (Right Knee)  Patient Location: PACU  Anesthesia Type:MAC and Spinal  Level of Consciousness: awake, alert  and oriented  Airway & Oxygen Therapy: Patient Spontanous Breathing and Patient connected to face mask oxygen  Post-op Assessment: Report given to RN and Post -op Vital signs reviewed and stable  Post vital signs: Reviewed and stable  Last Vitals:  Vitals Value Taken Time  BP 90/54 01/06/20 1139  Temp    Pulse 77 01/06/20 1140  Resp 20 01/06/20 1140  SpO2 98 % 01/06/20 1140  Vitals shown include unvalidated device data.  Last Pain:  Vitals:   01/06/20 0706  TempSrc:   PainSc: 0-No pain         Complications: No complications documented.

## 2020-01-06 NOTE — Evaluation (Signed)
Physical Therapy Evaluation Patient Details Name: [name] MRN: [id number] DOB: 1961-03-03 Today's Date: 01/06/2020   History of Present Illness  Patient is 59 y.o. female s/p Rt TKA on 01/06/20 with PMH significant for stroke, RBBB, HTN, OA, anemia.  Clinical Impression  [name] is a 59 y.o. female POD 0 s/p Rt TKA. Patient reports independence with mobility at baseline. Patient is now limited by functional impairments (see PT problem list below) and requires min guard for transfers and gait with RW. Patient was able to ambulate ~80 feet with RW and min guard/supervision and cues for safe walker management. Patient requires use of Rt knee immobilizer for stability to prevent knee buckling in stance phase. Pt educated on safe reverse step up technique to complete car transfer into SUV car as that is what she will be going home in. Following gait, mobilizing to bathroom and stair training, pt c/o dizziness, flush feeling and found to be hypotension sitting EOB (see vitals). Min assist to return to supine. Patient instructed in exercises to facilitate ROM and circulation. Patient will benefit from continued skilled PT interventions to address impairments and progress towards PLOF.  Vitals: Time           Position                  BP 1631           Seated                      95/69 1652           Seated (post-gait)    73/53 1655           Supine                      111/60 1700           Supine                      112/66      Follow Up Recommendations Follow surgeon's recommendation for DC plan and follow-up therapies;Outpatient PT    Equipment Recommendations  None recommended by PT    Recommendations for Other Services       Precautions / Restrictions Precautions Precautions: Fall Restrictions Weight Bearing Restrictions: No Other Position/Activity Restrictions: WBAT      Mobility  Bed Mobility Overal bed mobility: Needs Assistance Bed Mobility: Supine to Sit;Sit to  Supine     Supine to sit: Supervision;Min guard Sit to supine: Min assist;Mod assist   General bed mobility comments: cues for use of belt to assist     Transfers Overall transfer level: Needs assistance Equipment used: Rolling walker (2 wheeled) Transfers: Sit to/from Stand Sit to Stand: Min guard;Supervision            Ambulation/Gait Ambulation/Gait assistance: Supervision;Min guard Gait Distance (Feet): 80 Feet Assistive device: Rolling walker (2 wheeled) Gait Pattern/deviations: Step-to pattern Gait velocity: decr   General Gait Details: cues for safe step pattern in RW, no overt LOB,   Stairs Stairs: Yes Stairs assistance: Min guard Stair Management: No rails;With walker;Backwards Number of Stairs: 1    Wheelchair Mobility    Modified Rankin (Stroke Patients Only)       Balance Overall balance assessment: Needs assistance Sitting-balance support: Feet supported Sitting balance-Leahy Scale: Good     Standing balance support: During functional activity;Bilateral upper extremity supported Standing balance-Leahy Scale: Fair Standing balance  comment: pt able to wash hands at sink                             Pertinent Vitals/Pain Pain Assessment: No/denies pain    Home Living Family/patient expects to be discharged to:: Private residence Living Arrangements: Spouse/significant other Available Help at Discharge: Family Type of Home: House Home Access: Ramped entrance     Home Layout: Two level;Full bath on main level;Able to live on main level with bedroom/bathroom Home Equipment: Gilford Rile - 2 wheels;Bedside commode;Grab bars - tub/shower;Kasandra Knudsen - single point Additional Comments: pt lives with husband, he is independent but can offer limited physical assist due to prior injuries of his own.     Prior Function Level of Independence: Independent         Comments: pt works as an Restaurant manager, fast food:  Right    Extremity/Trunk Assessment   Upper Extremity Assessment Upper Extremity Assessment: Overall WFL for tasks assessed    Lower Extremity Assessment Lower Extremity Assessment: RLE deficits/detail RLE Deficits / Details: pt with no quad activation and unable to perform LAQ or SLR. knee immobilizer donned for mobility. RLE: Unable to fully assess due to immobilization RLE Sensation: WNL RLE Coordination: WNL    Cervical / Trunk Assessment Cervical / Trunk Assessment: Normal  Communication   Communication: No difficulties  Cognition Arousal/Alertness: Awake/alert Behavior During Therapy: WFL for tasks assessed/performed Overall Cognitive Status: Within Functional Limits for tasks assessed                                        General Comments      Exercises Total Joint Exercises Ankle Circles/Pumps: AROM;Both;20 reps;Supine   Assessment/Plan    PT Assessment Patient needs continued PT services  PT Problem List Decreased strength;Decreased range of motion;Decreased activity tolerance;Decreased balance;Decreased mobility;Decreased knowledge of use of DME;Decreased knowledge of precautions       PT Treatment Interventions DME instruction;Gait training;Stair training;Functional mobility training;Therapeutic activities;Therapeutic exercise;Balance training;Patient/family education    PT Goals (Current goals can be found in the Care Plan section)  Acute Rehab PT Goals Patient Stated Goal: get back to independence PT Goal Formulation: With patient Time For Goal Achievement: 01/13/20 Potential to Achieve Goals: Good    Frequency 7X/week   Barriers to discharge        Co-evaluation               AM-PAC PT "6 Clicks" Mobility  Outcome Measure Help needed turning from your back to your side while in a flat bed without using bedrails?: None Help needed moving from lying on your back to sitting on the side of a flat bed without using bedrails?:  A Little Help needed moving to and from a bed to a chair (including a wheelchair)?: A Little Help needed standing up from a chair using your arms (e.g., wheelchair or bedside chair)?: A Little Help needed to walk in hospital room?: A Little Help needed climbing 3-5 steps with a railing? : A Little 6 Click Score: 19    End of Session Equipment Utilized During Treatment: Gait belt Activity Tolerance: Patient tolerated treatment well;Treatment limited secondary to medical complications (Comment) (pt with orthostatic hypotension after mobilizing) Patient left: in bed;with call bell/phone within reach Nurse Communication: Mobility status PT Visit Diagnosis: Muscle weakness (generalized) (  M62.81);Difficulty in walking, not elsewhere classified (R26.2)    Time: 6770-3403 PT Time Calculation (min) (ACUTE ONLY): 40 min   Charges:   PT Evaluation $PT Eval Low Complexity: 1 Low PT Treatments $Gait Training: 8-22 mins $Therapeutic Activity: 8-22 mins        Verner Mould, DPT Acute Rehabilitation Services  Office [phone] Pager [phone]  01/06/2020 5:42 PM

## 2020-01-07 ENCOUNTER — Encounter (HOSPITAL_COMMUNITY): Payer: Self-pay | Admitting: Orthopedic Surgery

## 2020-01-07 ENCOUNTER — Other Ambulatory Visit: Payer: Self-pay

## 2020-01-07 DIAGNOSIS — M1711 Unilateral primary osteoarthritis, right knee: Secondary | ICD-10-CM | POA: Diagnosis not present

## 2020-01-07 DIAGNOSIS — Z7901 Long term (current) use of anticoagulants: Secondary | ICD-10-CM | POA: Diagnosis not present

## 2020-01-07 DIAGNOSIS — M25561 Pain in right knee: Secondary | ICD-10-CM | POA: Diagnosis not present

## 2020-01-07 DIAGNOSIS — I10 Essential (primary) hypertension: Secondary | ICD-10-CM | POA: Diagnosis not present

## 2020-01-07 DIAGNOSIS — Z79899 Other long term (current) drug therapy: Secondary | ICD-10-CM | POA: Diagnosis not present

## 2020-01-07 DIAGNOSIS — Z7722 Contact with and (suspected) exposure to environmental tobacco smoke (acute) (chronic): Secondary | ICD-10-CM | POA: Diagnosis not present

## 2020-01-07 LAB — CBC
HCT: 31.8 % — ABNORMAL LOW (ref 36.0–46.0)
Hemoglobin: 10.8 g/dL — ABNORMAL LOW (ref 12.0–15.0)
MCH: 31.4 pg (ref 26.0–34.0)
MCHC: 34 g/dL (ref 30.0–36.0)
MCV: 92.4 fL (ref 80.0–100.0)
Platelets: 201 10*3/uL (ref 150–400)
RBC: 3.44 MIL/uL — ABNORMAL LOW (ref 3.87–5.11)
RDW: 12.6 % (ref 11.5–15.5)
WBC: 8 10*3/uL (ref 4.0–10.5)
nRBC: 0 % (ref 0.0–0.2)

## 2020-01-07 LAB — BASIC METABOLIC PANEL
Anion gap: 10 (ref 5–15)
BUN: 19 mg/dL (ref 8–23)
CO2: 25 mmol/L (ref 22–32)
Calcium: 8.2 mg/dL — ABNORMAL LOW (ref 8.9–10.3)
Chloride: 97 mmol/L — ABNORMAL LOW (ref 98–111)
Creatinine, Ser: 1.3 mg/dL — ABNORMAL HIGH (ref 0.44–1.00)
GFR, Estimated: 44 mL/min — ABNORMAL LOW (ref >60)
Glucose, Bld: 155 mg/dL — ABNORMAL HIGH (ref 70–99)
Potassium: 3.5 mmol/L (ref 3.5–5.1)
Sodium: 132 mmol/L — ABNORMAL LOW (ref 135–145)

## 2020-01-07 MED ORDER — SENNA 8.6 MG PO TABS
1.0000 | ORAL_TABLET | Freq: Two times a day (BID) | ORAL | Status: DC
  Administered 2020-01-07: 8.6 mg via ORAL
  Filled 2020-01-07: qty 1

## 2020-01-07 MED ORDER — SODIUM CHLORIDE 0.9 % IV SOLN: INTRAVENOUS | Status: DC

## 2020-01-07 MED ORDER — DEXAMETHASONE SODIUM PHOSPHATE 10 MG/ML IJ SOLN
10.0000 mg | Freq: Once | INTRAMUSCULAR | Status: AC
  Administered 2020-01-07: 10 mg via INTRAVENOUS
  Filled 2020-01-07: qty 1

## 2020-01-07 MED ORDER — PHENOL 1.4 % MT LIQD: 1.0000 | OROMUCOSAL | Status: DC | PRN

## 2020-01-07 MED ORDER — CLOPIDOGREL BISULFATE 75 MG PO TABS: 75.0000 mg | ORAL_TABLET | Freq: Every evening | ORAL | Status: DC

## 2020-01-07 MED ORDER — LOSARTAN POTASSIUM-HCTZ 100-12.5 MG PO TABS: 1.0000 | ORAL_TABLET | Freq: Every evening | ORAL | Status: DC

## 2020-01-07 MED ORDER — ASPIRIN 81 MG PO CHEW
81.0000 mg | CHEWABLE_TABLET | Freq: Two times a day (BID) | ORAL | Status: DC
  Administered 2020-01-07: 81 mg via ORAL
  Filled 2020-01-07: qty 1

## 2020-01-07 MED ORDER — HYDROCHLOROTHIAZIDE 12.5 MG PO CAPS: 12.5000 mg | ORAL_CAPSULE | Freq: Every day | ORAL | Status: DC

## 2020-01-07 MED ORDER — MENTHOL 3 MG MT LOZG: 1.0000 | LOZENGE | OROMUCOSAL | Status: DC | PRN

## 2020-01-07 MED ORDER — ALUM & MAG HYDROXIDE-SIMETH 200-200-20 MG/5ML PO SUSP: 30.0000 mL | ORAL | Status: DC | PRN

## 2020-01-07 MED ORDER — LOSARTAN POTASSIUM 50 MG PO TABS: 100.0000 mg | ORAL_TABLET | Freq: Every day | ORAL | Status: DC

## 2020-01-07 MED ORDER — LEVOTHYROXINE SODIUM 100 MCG PO TABS
100.0000 ug | ORAL_TABLET | Freq: Every day | ORAL | Status: DC
  Administered 2020-01-07: 100 ug via ORAL
  Filled 2020-01-07: qty 1

## 2020-01-07 MED ORDER — ATORVASTATIN CALCIUM 10 MG PO TABS: 10.0000 mg | ORAL_TABLET | Freq: Every evening | ORAL | Status: DC

## 2020-01-07 MED ORDER — DIPHENHYDRAMINE HCL 12.5 MG/5ML PO ELIX: 12.5000 mg | ORAL_SOLUTION | ORAL | Status: DC | PRN

## 2020-01-07 MED ORDER — POLYETHYLENE GLYCOL 3350 17 G PO PACK: 17.0000 g | PACK | Freq: Every day | ORAL | Status: DC | PRN

## 2020-01-07 MED ORDER — ESCITALOPRAM OXALATE 10 MG PO TABS
10.0000 mg | ORAL_TABLET | Freq: Every day | ORAL | Status: DC
  Administered 2020-01-07: 10 mg via ORAL
  Filled 2020-01-07: qty 1

## 2020-01-07 MED ORDER — DOCUSATE SODIUM 100 MG PO CAPS
100.0000 mg | ORAL_CAPSULE | Freq: Two times a day (BID) | ORAL | Status: DC
  Administered 2020-01-07: 100 mg via ORAL
  Filled 2020-01-07: qty 1

## 2020-01-07 NOTE — Progress Notes (Signed)
    Subjective:  Patient reports pain as mild to moderate.  Denies N/V/CP/SOB. Patient is resting in bed and states that she hasn't regained her strength in her right leg yet  Objective:   VITALS:   Vitals:   01/06/20 2051 01/06/20 2123 01/07/20 0154 01/07/20 0616  BP: 104/62 106/63 105/60 103/63  Pulse: 72 74 81 67  Resp: 20  20 16   Temp: 98.3 F (36.8 C) 98.5 F (36.9 C) 99 F (37.2 C) 98.2 F (36.8 C)  TempSrc:  Oral Oral Oral  SpO2: 96% 97% 90% 95%  Weight:      Height:        NAD ABD soft Neurovascular intact Sensation intact distally Intact pulses distally Dorsiflexion/Plantar flexion intact Incision: dressing C/D/I   Lab Results  Component Value Date   WBC 8.0 01/07/2020   HGB 10.8 (L) 01/07/2020   HCT 31.8 (L) 01/07/2020   MCV 92.4 01/07/2020   PLT 201 01/07/2020   BMET    Component Value Date/Time   NA 132 (L) 01/07/2020 0306   K 3.5 01/07/2020 0306   CL 97 (L) 01/07/2020 0306   CO2 25 01/07/2020 0306   GLUCOSE 155 (H) 01/07/2020 0306   BUN 19 01/07/2020 0306   CREATININE 1.30 (H) 01/07/2020 0306   CALCIUM 8.2 (L) 01/07/2020 0306   GFRNONAA 44 (L) 01/07/2020 0306   GFRAA >90 01/15/2014 0500     Assessment/Plan: 1 Day Post-Op   Principal Problem:   Osteoarthritis of right knee Active Problems:   S/P total knee arthroplasty, right   WBAT with walker DVT ppx: Aspirin, SCDs, TEDS PO pain control PT/OT Dispo: D/C home once cleared PT     Dorothyann Peng 01/07/2020, 7:40 AM   [facility] is now [address] [place], [place], [place] [address] Phone: [phone] [email or web address] Facebook  [employer]

## 2020-01-07 NOTE — Op Note (Addendum)
OPERATIVE REPORT  SURGEON: Rod Can, MD   ASSISTANT: Cherlynn June, PA-C  PREOPERATIVE DIAGNOSIS: Right knee arthritis.   POSTOPERATIVE DIAGNOSIS: Right knee arthritis.   PROCEDURE: Right total knee arthroplasty.   IMPLANTS: Stryker Triathlon CR femur, size 6. Stryker Tritanium tibia, size 5. X3 polyethelyene insert, size 9 mm, CS. 3 button asymmetric patella, size 35 mm.  ANESTHESIA:  MAC, Regional and Spinal  TOURNIQUET TIME: Not utilized.   ESTIMATED BLOOD LOSS:-300 mL    ANTIBIOTICS: 2g Ancef.  DRAINS: None.  COMPLICATIONS: None   CONDITION: PACU - hemodynamically stable.   BRIEF CLINICAL NOTE: [name] is a 59 y.o. female with a long-standing history of Right knee arthritis. After failing conservative management, the patient was indicated for total knee arthroplasty. The risks, benefits, and alternatives to the procedure were explained, and the patient elected to proceed.  PROCEDURE IN DETAIL: Adductor canal block was obtained in the pre-op holding area. Once inside the operative room, spinal anesthesia was obtained, and a foley catheter was inserted. The patient was then positioned, a nonsterile tourniquet was placed, and the lower extremity was prepped and draped in the normal sterile surgical fashion.  A time-out was called verifying side and site of surgery. The patient received IV antibiotics within 60 minutes of beginning the procedure. The tourniquet was not utilized.   An anterior approach to the knee was performed utilizing a midvastus arthrotomy. A medial release was performed and the patellar fat pad was excised. Stryker navigation was used to cut the distal femur perpendicular to the mechanical axis. A freehand patellar resection was performed, and the patella was sized an prepared with 3 lug holes.  Nagivation was used to make a neutral proximal tibia  resection, taking 4 mm of bone from the less affected medial side with 3 degrees of slope. The menisci were excised. A spacer block was placed, and the alignment and balance in extension were confirmed.   The distal femur was sized using the 3-degree external rotation guide referencing the posterior femoral cortex. The appropriate 4-in-1 cutting block was pinned into place. Rotation was checked using Whiteside's line, the epicondylar axis, and then confirmed with a spacer block in flexion. The remaining femoral cuts were performed, taking care to protect the MCL.  The tibia was sized and the trial tray was pinned into place. The remaining trail components were inserted. The knee was stable to varus and valgus stress through a full range of motion. The patella tracked centrally, and the PCL was well balanced. The trial components were removed, and the proximal tibial surface was prepared. Final components were impacted into place. The knee was tested for a final time and found to be well balanced.   The wound was copiously irrigated with Irrisept solution and normal saline using pule lavage.  Marcaine solution was injected into the periarticular soft tissue.  The wound was closed in layers using #1 Vicryl and Stratafix for the fascia, 2-0 Vicryl for the subcutaneous fat, 2-0 Monocryl for the deep dermal layer, 3-0 running Monocryl subcuticular Stitch, and 4-0 Monocryl stay sutures at both ends of the wound. Dermabond was applied to the skin.  Once the glue was fully dried, an Aquacell Ag and compressive dressing were applied.  Tthe patient was transported to the recovery room in stable condition.  Sponge, needle, and instrument counts were correct at the end of the case x2.  The patient tolerated the procedure well and there were no known complications.  Please note that a  surgical assistant was a medical necessity for this procedure in order to perform it in a safe and expeditious manner. Surgical assistant  was necessary to retract the ligaments and vital neurovascular structures to prevent injury to them and also necessary for proper positioning of the limb to allow for anatomic placement of the prosthesis.

## 2020-01-07 NOTE — Progress Notes (Signed)
Physical Therapy Treatment Patient Details Name: [name] MRN: [id number] DOB: Aug 15, 1960 Today's Date: 01/07/2020    History of Present Illness Patient is 59 y.o. female s/p Rt TKA on 01/06/20 with PMH significant for stroke, RBBB, HTN, OA, anemia.    PT Comments    Pt has met PT goals and is ready to DC home. She ambulated 150' with RW, and R knee immobilizer, no loss of balance. KI utilized 2* weak R quad contraction. Pt demonstrates good understanding of HEP.   Follow Up Recommendations  Follow surgeon's recommendation for DC plan and follow-up therapies;Outpatient PT     Equipment Recommendations  None recommended by PT    Recommendations for Other Services       Precautions / Restrictions Precautions Precautions: Fall Restrictions Other Position/Activity Restrictions: WBAT    Mobility  Bed Mobility Overal bed mobility: Modified Independent             General bed mobility comments: used belt looped around R foot as leg lifter, HOB up  Transfers Overall transfer level: Modified independent Equipment used: Rolling walker (2 wheeled) Transfers: Sit to/from Stand Sit to Stand: Modified independent (Device/Increase time)         General transfer comment: good hand placement  Ambulation/Gait Ambulation/Gait assistance: Modified independent (Device/Increase time) Gait Distance (Feet): 150 Feet Assistive device: Rolling walker (2 wheeled)   Gait velocity: decr   General Gait Details: good sequencing, used KI 2* weak quad contraction, no loss of balance   Stairs             Wheelchair Mobility    Modified Rankin (Stroke Patients Only)       Balance Overall balance assessment: Needs assistance Sitting-balance support: Feet supported Sitting balance-Leahy Scale: Good     Standing balance support: During functional activity;Bilateral upper extremity supported Standing balance-Leahy Scale: Fair Standing balance comment: pt able to wash  hands at sink                            Cognition Arousal/Alertness: Awake/alert Behavior During Therapy: WFL for tasks assessed/performed Overall Cognitive Status: Within Functional Limits for tasks assessed                                        Exercises Total Joint Exercises Ankle Circles/Pumps: AROM;Both;20 reps;Supine Quad Sets: AROM;Both;10 reps;Supine Short Arc Quad: AAROM;Right;10 reps;Supine Heel Slides: AAROM;Right;10 reps;Supine Hip ABduction/ADduction: AAROM;Right;10 reps;Supine Straight Leg Raises: AAROM;Right;5 reps;Supine Long Arc Quad: AAROM;Right;10 reps;Seated Knee Flexion: AAROM;Right;10 reps;Seated Goniometric ROM: 5-65* AAROM R knee    General Comments        Pertinent Vitals/Pain Pain Assessment: 0-10 Pain Score: 4  Pain Location: R knee with walking Pain Descriptors / Indicators: Sore Pain Intervention(s): Limited activity within patient's tolerance;Monitored during session;Premedicated before session;Ice applied    Home Living                      Prior Function            PT Goals (current goals can now be found in the care plan section) Acute Rehab PT Goals Patient Stated Goal: get back to independence PT Goal Formulation: With patient Time For Goal Achievement: 01/13/20 Potential to Achieve Goals: Good Progress towards PT goals: Progressing toward goals    Frequency    7X/week  PT Plan Current plan remains appropriate    Co-evaluation              AM-PAC PT "6 Clicks" Mobility   Outcome Measure  Help needed turning from your back to your side while in a flat bed without using bedrails?: None Help needed moving from lying on your back to sitting on the side of a flat bed without using bedrails?: None Help needed moving to and from a bed to a chair (including a wheelchair)?: None Help needed standing up from a chair using your arms (e.g., wheelchair or bedside chair)?: None Help  needed to walk in hospital room?: None Help needed climbing 3-5 steps with a railing? : A Little 6 Click Score: 23    End of Session Equipment Utilized During Treatment: Gait belt Activity Tolerance: Patient tolerated treatment well Patient left: with call bell/phone within reach;in chair Nurse Communication: Mobility status PT Visit Diagnosis: Muscle weakness (generalized) (M62.81);Difficulty in walking, not elsewhere classified (R26.2)     Time: 2103-1281 PT Time Calculation (min) (ACUTE ONLY): 49 min  Charges:  $Gait Training: 8-22 mins $Therapeutic Exercise: 8-22 mins $Therapeutic Activity: 8-22 mins                     Blondell Reveal Kistler PT 01/07/2020  Acute Rehabilitation Services Pager [phone] Office [phone]

## 2020-01-11 DIAGNOSIS — Z96651 Presence of right artificial knee joint: Secondary | ICD-10-CM | POA: Diagnosis not present

## 2020-01-11 DIAGNOSIS — M1711 Unilateral primary osteoarthritis, right knee: Secondary | ICD-10-CM | POA: Diagnosis not present

## 2020-01-11 DIAGNOSIS — M25661 Stiffness of right knee, not elsewhere classified: Secondary | ICD-10-CM | POA: Diagnosis not present

## 2020-01-11 DIAGNOSIS — M25561 Pain in right knee: Secondary | ICD-10-CM | POA: Diagnosis not present

## 2020-01-12 DIAGNOSIS — M1711 Unilateral primary osteoarthritis, right knee: Secondary | ICD-10-CM | POA: Diagnosis not present

## 2020-01-12 DIAGNOSIS — Z96651 Presence of right artificial knee joint: Secondary | ICD-10-CM | POA: Diagnosis not present

## 2020-01-12 DIAGNOSIS — M25661 Stiffness of right knee, not elsewhere classified: Secondary | ICD-10-CM | POA: Diagnosis not present

## 2020-01-12 DIAGNOSIS — M25561 Pain in right knee: Secondary | ICD-10-CM | POA: Diagnosis not present

## 2020-01-13 DIAGNOSIS — M1711 Unilateral primary osteoarthritis, right knee: Secondary | ICD-10-CM | POA: Diagnosis not present

## 2020-01-13 DIAGNOSIS — Z96651 Presence of right artificial knee joint: Secondary | ICD-10-CM | POA: Diagnosis not present

## 2020-01-13 DIAGNOSIS — Z79899 Other long term (current) drug therapy: Secondary | ICD-10-CM | POA: Diagnosis not present

## 2020-01-13 DIAGNOSIS — E039 Hypothyroidism, unspecified: Secondary | ICD-10-CM | POA: Diagnosis not present

## 2020-01-13 DIAGNOSIS — M25561 Pain in right knee: Secondary | ICD-10-CM | POA: Diagnosis not present

## 2020-01-13 DIAGNOSIS — R7303 Prediabetes: Secondary | ICD-10-CM | POA: Diagnosis not present

## 2020-01-13 DIAGNOSIS — R7301 Impaired fasting glucose: Secondary | ICD-10-CM | POA: Diagnosis not present

## 2020-01-13 DIAGNOSIS — M25661 Stiffness of right knee, not elsewhere classified: Secondary | ICD-10-CM | POA: Diagnosis not present

## 2020-01-14 DIAGNOSIS — Z96651 Presence of right artificial knee joint: Secondary | ICD-10-CM | POA: Diagnosis not present

## 2020-01-14 DIAGNOSIS — M25661 Stiffness of right knee, not elsewhere classified: Secondary | ICD-10-CM | POA: Diagnosis not present

## 2020-01-14 DIAGNOSIS — M25561 Pain in right knee: Secondary | ICD-10-CM | POA: Diagnosis not present

## 2020-01-14 DIAGNOSIS — M1711 Unilateral primary osteoarthritis, right knee: Secondary | ICD-10-CM | POA: Diagnosis not present

## 2020-01-15 DIAGNOSIS — M25561 Pain in right knee: Secondary | ICD-10-CM | POA: Diagnosis not present

## 2020-01-15 DIAGNOSIS — E039 Hypothyroidism, unspecified: Secondary | ICD-10-CM | POA: Diagnosis not present

## 2020-01-15 DIAGNOSIS — M1711 Unilateral primary osteoarthritis, right knee: Secondary | ICD-10-CM | POA: Diagnosis not present

## 2020-01-15 DIAGNOSIS — Z96651 Presence of right artificial knee joint: Secondary | ICD-10-CM | POA: Diagnosis not present

## 2020-01-15 DIAGNOSIS — M25661 Stiffness of right knee, not elsewhere classified: Secondary | ICD-10-CM | POA: Diagnosis not present

## 2020-01-15 DIAGNOSIS — R7301 Impaired fasting glucose: Secondary | ICD-10-CM | POA: Diagnosis not present

## 2020-01-16 NOTE — Discharge Summary (Signed)
Physician Discharge Summary  Patient ID: [name] MRN: [id number] DOB/AGE: 61/03/1960 61 y.o.  Admit date: 01/06/2020 Discharge date: 01/07/2020  Admission Diagnoses:  Osteoarthritis of right knee  Discharge Diagnoses:  Principal Problem:   Osteoarthritis of right knee Active Problems:   S/P total knee arthroplasty, right   Past Medical History:  Diagnosis Date   Anemia    prior to hysterectomy    Arthritis    right knee has injections last one is 01/12/2014   Cellulitis    hx of in left leg treated with keflex approx 6 weeks ago    Complication of anesthesia    Dysrhythmia    hx of palpitations 2009 prior to dx with thyroid    History of blood transfusion    History of kidney stones    Hypertension    Hyperthyroidism    Incomplete RBBB 2015   Noted on EKG   PONV (postoperative nausea and vomiting)    Stroke ([facility])    1995    Surgeries: Procedure(s): COMPUTER ASSISTED TOTAL KNEE ARTHROPLASTY on 01/06/2020   Consultants (if any):   Discharged Condition: Improved  Hospital Course: [name] is an 59 y.o. female who was admitted 01/06/2020 with a diagnosis of Osteoarthritis of right knee and went to the operating room on 01/06/2020 and underwent the above named procedures.    She was given perioperative antibiotics:  Anti-infectives (From admission, onward)   Start     Dose/Rate Route Frequency Ordered Stop   01/06/20 2042  ceFAZolin (ANCEF) 2-4 GM/100ML-% IVPB       Note to Pharmacy: Benjamin Stain   : cabinet override      01/06/20 2042 01/06/20 2049   01/06/20 1430  ceFAZolin (ANCEF) IVPB 2g/100 mL premix        2 g 200 mL/hr over 30 Minutes Intravenous Every 6 hours 01/06/20 1135 01/06/20 2120   01/06/20 1406  ceFAZolin (ANCEF) 2-4 GM/100ML-% IVPB       Note to Pharmacy: Floreen Comber   : cabinet override      01/06/20 1406 01/06/20 1434   01/06/20 0630  ceFAZolin (ANCEF) IVPB 2g/100 mL premix        2 g 200 mL/hr over 30 Minutes  Intravenous On call to O.R. 01/06/20 2025 01/06/20 0835    .  She was given sequential compression devices, early ambulation, and ASA for DVT prophylaxis.  She benefited maximally from the hospital stay and there were no complications.    Recent vital signs:  Vitals:   01/07/20 0616 01/07/20 0935  BP: 103/63 98/66  Pulse: 67 80  Resp: 16 18  Temp: 98.2 F (36.8 C) 98.1 F (36.7 C)  SpO2: 95% 91%    Recent laboratory studies:  Lab Results  Component Value Date   HGB 10.8 (L) 01/07/2020   HGB 14.5 12/29/2019   HGB 12.6 01/11/2014   Lab Results  Component Value Date   WBC 8.0 01/07/2020   PLT 201 01/07/2020   Lab Results  Component Value Date   INR 1.0 12/29/2019   Lab Results  Component Value Date   NA 132 (L) 01/07/2020   K 3.5 01/07/2020   CL 97 (L) 01/07/2020   CO2 25 01/07/2020   BUN 19 01/07/2020   CREATININE 1.30 (H) 01/07/2020   GLUCOSE 155 (H) 01/07/2020    Discharge Medications:   Allergies as of 01/07/2020      Reactions   Shrimp [shellfish Allergy] Anaphylaxis   Lisinopril Cough  Medication List    TAKE these medications   aspirin 81 MG chewable tablet Commonly known as: Aspirin Childrens Chew 1 tablet (81 mg total) by mouth 2 (two) times daily with a meal.   atorvastatin 10 MG tablet Commonly known as: LIPITOR Take 10 mg by mouth every evening.   clopidogrel 75 MG tablet Commonly known as: PLAVIX Take 75 mg by mouth every evening.   docusate sodium 100 MG capsule Commonly known as: Colace Take 1 capsule (100 mg total) by mouth 2 (two) times daily. Notes to patient: Stool softener   escitalopram 10 MG tablet Commonly known as: LEXAPRO Take 10 mg by mouth daily.   ibuprofen 800 MG tablet Commonly known as: ADVIL Take 1 tablet (800 mg total) by mouth every 8 (eight) hours as needed. What changed: when to take this   levothyroxine 100 MCG tablet Commonly known as: SYNTHROID Take 100 mcg by mouth daily before breakfast.    losartan-hydrochlorothiazide 100-12.5 MG tablet Commonly known as: HYZAAR Take 1 tablet by mouth every evening.   ondansetron 4 MG tablet Commonly known as: Zofran Take 1 tablet (4 mg total) by mouth every 8 (eight) hours as needed for nausea or vomiting.   oxyCODONE 5 MG immediate release tablet Commonly known as: Roxicodone Take 1 tablet (5 mg total) by mouth every 4 (four) hours as needed for severe pain.   propranolol 40 MG tablet Commonly known as: INDERAL Take 1 tablet (40 mg total) by mouth at bedtime.   senna 8.6 MG Tabs tablet Commonly known as: SENOKOT Take 2 tablets (17.2 mg total) by mouth at bedtime.       Diagnostic Studies: DG Knee Right Port  Result Date: 01/06/2020 CLINICAL DATA:  Status post right knee replacement. EXAM: PORTABLE RIGHT KNEE - 1-2 VIEW COMPARISON:  None. FINDINGS: The right femoral, tibial and patellar components appear to be well situated. Expected postoperative changes are noted in the soft tissues anteriorly. IMPRESSION: Status post right total knee arthroplasty. Electronically Signed   By: Marijo Conception M.D.   On: 01/06/2020 12:32   MM 3D SCREEN BREAST BILATERAL  Result Date: 12/30/2019 CLINICAL DATA:  Screening. EXAM: DIGITAL SCREENING BILATERAL MAMMOGRAM WITH TOMO AND CAD COMPARISON:  Previous exam(s). ACR Breast Density Category a: The breast tissue is almost entirely fatty. FINDINGS: There are no findings suspicious for malignancy. Images were processed with CAD. IMPRESSION: No mammographic evidence of malignancy. A result letter of this screening mammogram will be mailed directly to the patient. RECOMMENDATION: Screening mammogram in one year. (Code:SM-B-01Y) BI-RADS CATEGORY  1: Negative. Electronically Signed   By: Lajean Manes M.D.   On: 12/30/2019 08:20    Disposition: Discharge disposition: 01-Home or Self Care       Discharge Instructions    Call MD / Call 911   Complete by: As directed    If you experience chest pain  or shortness of breath, CALL 911 and be transported to the hospital emergency room.  If you develope a fever above 101 F, pus (white drainage) or increased drainage or redness at the wound, or calf pain, call your surgeon's office.   Call MD / Call 911   Complete by: As directed    If you experience chest pain or shortness of breath, CALL 911 and be transported to the hospital emergency room.  If you develope a fever above 101 F, pus (white drainage) or increased drainage or redness at the wound, or calf pain, call your surgeon's office.  Call MD / Call 911   Complete by: As directed    If you experience chest pain or shortness of breath, CALL 911 and be transported to the hospital emergency room.  If you develope a fever above 101 F, pus (white drainage) or increased drainage or redness at the wound, or calf pain, call your surgeon's office.   Constipation Prevention   Complete by: As directed    Drink plenty of fluids.  Prune juice may be helpful.  You may use a stool softener, such as Colace (over the counter) 100 mg twice a day.  Use MiraLax (over the counter) for constipation as needed.   Constipation Prevention   Complete by: As directed    Drink plenty of fluids.  Prune juice may be helpful.  You may use a stool softener, such as Colace (over the counter) 100 mg twice a day.  Use MiraLax (over the counter) for constipation as needed.   Constipation Prevention   Complete by: As directed    Drink plenty of fluids.  Prune juice may be helpful.  You may use a stool softener, such as Colace (over the counter) 100 mg twice a day.  Use MiraLax (over the counter) for constipation as needed.   Diet - low sodium heart healthy   Complete by: As directed    Diet - low sodium heart healthy   Complete by: As directed    Diet - low sodium heart healthy   Complete by: As directed    Do not put a pillow under the knee. Place it under the heel.   Complete by: As directed    Do not put a pillow under  the knee. Place it under the heel.   Complete by: As directed    Do not put a pillow under the knee. Place it under the heel.   Complete by: As directed    Driving restrictions   Complete by: As directed    No driving for 6 weeks   Driving restrictions   Complete by: As directed    No driving for 2 weeks   Driving restrictions   Complete by: As directed    No driving for 2 weeks   Increase activity slowly as tolerated   Complete by: As directed    Increase activity slowly as tolerated   Complete by: As directed    Increase activity slowly as tolerated   Complete by: As directed    Lifting restrictions   Complete by: As directed    No lifting for 6 weeks   Lifting restrictions   Complete by: As directed    No lifting for 2 weeks   Lifting restrictions   Complete by: As directed    No lifting for 2 weeks   TED hose   Complete by: As directed    Use stockings (TED hose) for 2 weeks on both leg(s).  You may remove them at night for sleeping.   TED hose   Complete by: As directed    Use stockings (TED hose) for 2 weeks on both leg(s).  You may remove them at night for sleeping.   TED hose   Complete by: As directed    Use stockings (TED hose) for 2 weeks on both leg(s).  You may remove them at night for sleeping.       Follow-up Information    Yanna Leaks, Aaron Edelman, MD. Schedule an appointment as soon as possible for a visit in 2 weeks.  Specialty: Orthopedic Surgery Why: For wound re-check Contact information: [address] [place] [address] [phone]                Signed: Hilton Cork Teion Ballin 01/16/2020, 7:53 AM

## 2020-01-18 DIAGNOSIS — Z96651 Presence of right artificial knee joint: Secondary | ICD-10-CM | POA: Diagnosis not present

## 2020-01-18 DIAGNOSIS — M25561 Pain in right knee: Secondary | ICD-10-CM | POA: Diagnosis not present

## 2020-01-18 DIAGNOSIS — M1711 Unilateral primary osteoarthritis, right knee: Secondary | ICD-10-CM | POA: Diagnosis not present

## 2020-01-18 DIAGNOSIS — M25661 Stiffness of right knee, not elsewhere classified: Secondary | ICD-10-CM | POA: Diagnosis not present

## 2020-01-19 DIAGNOSIS — Z96651 Presence of right artificial knee joint: Secondary | ICD-10-CM | POA: Diagnosis not present

## 2020-01-19 DIAGNOSIS — Z471 Aftercare following joint replacement surgery: Secondary | ICD-10-CM | POA: Diagnosis not present

## 2020-01-20 DIAGNOSIS — M25661 Stiffness of right knee, not elsewhere classified: Secondary | ICD-10-CM | POA: Diagnosis not present

## 2020-01-20 DIAGNOSIS — Z96651 Presence of right artificial knee joint: Secondary | ICD-10-CM | POA: Diagnosis not present

## 2020-01-20 DIAGNOSIS — M25561 Pain in right knee: Secondary | ICD-10-CM | POA: Diagnosis not present

## 2020-01-20 DIAGNOSIS — M1711 Unilateral primary osteoarthritis, right knee: Secondary | ICD-10-CM | POA: Diagnosis not present

## 2020-01-21 DIAGNOSIS — M25661 Stiffness of right knee, not elsewhere classified: Secondary | ICD-10-CM | POA: Diagnosis not present

## 2020-01-21 DIAGNOSIS — M1711 Unilateral primary osteoarthritis, right knee: Secondary | ICD-10-CM | POA: Diagnosis not present

## 2020-01-21 DIAGNOSIS — M25561 Pain in right knee: Secondary | ICD-10-CM | POA: Diagnosis not present

## 2020-01-21 DIAGNOSIS — Z96651 Presence of right artificial knee joint: Secondary | ICD-10-CM | POA: Diagnosis not present

## 2020-01-25 DIAGNOSIS — M25561 Pain in right knee: Secondary | ICD-10-CM | POA: Diagnosis not present

## 2020-01-25 DIAGNOSIS — Z96651 Presence of right artificial knee joint: Secondary | ICD-10-CM | POA: Diagnosis not present

## 2020-01-25 DIAGNOSIS — M25661 Stiffness of right knee, not elsewhere classified: Secondary | ICD-10-CM | POA: Diagnosis not present

## 2020-01-25 DIAGNOSIS — M1711 Unilateral primary osteoarthritis, right knee: Secondary | ICD-10-CM | POA: Diagnosis not present

## 2020-01-27 DIAGNOSIS — M25661 Stiffness of right knee, not elsewhere classified: Secondary | ICD-10-CM | POA: Diagnosis not present

## 2020-01-27 DIAGNOSIS — M1711 Unilateral primary osteoarthritis, right knee: Secondary | ICD-10-CM | POA: Diagnosis not present

## 2020-01-27 DIAGNOSIS — Z96651 Presence of right artificial knee joint: Secondary | ICD-10-CM | POA: Diagnosis not present

## 2020-01-27 DIAGNOSIS — M25561 Pain in right knee: Secondary | ICD-10-CM | POA: Diagnosis not present

## 2020-01-28 DIAGNOSIS — M1711 Unilateral primary osteoarthritis, right knee: Secondary | ICD-10-CM | POA: Diagnosis not present

## 2020-01-28 DIAGNOSIS — Z96651 Presence of right artificial knee joint: Secondary | ICD-10-CM | POA: Diagnosis not present

## 2020-01-28 DIAGNOSIS — M25661 Stiffness of right knee, not elsewhere classified: Secondary | ICD-10-CM | POA: Diagnosis not present

## 2020-01-28 DIAGNOSIS — M25561 Pain in right knee: Secondary | ICD-10-CM | POA: Diagnosis not present

## 2020-02-01 DIAGNOSIS — M1711 Unilateral primary osteoarthritis, right knee: Secondary | ICD-10-CM | POA: Diagnosis not present

## 2020-02-01 DIAGNOSIS — Z96651 Presence of right artificial knee joint: Secondary | ICD-10-CM | POA: Diagnosis not present

## 2020-02-01 DIAGNOSIS — M25661 Stiffness of right knee, not elsewhere classified: Secondary | ICD-10-CM | POA: Diagnosis not present

## 2020-02-01 DIAGNOSIS — M25561 Pain in right knee: Secondary | ICD-10-CM | POA: Diagnosis not present

## 2020-02-04 DIAGNOSIS — Z96651 Presence of right artificial knee joint: Secondary | ICD-10-CM | POA: Diagnosis not present

## 2020-02-04 DIAGNOSIS — M25561 Pain in right knee: Secondary | ICD-10-CM | POA: Diagnosis not present

## 2020-02-04 DIAGNOSIS — M1711 Unilateral primary osteoarthritis, right knee: Secondary | ICD-10-CM | POA: Diagnosis not present

## 2020-02-04 DIAGNOSIS — M25661 Stiffness of right knee, not elsewhere classified: Secondary | ICD-10-CM | POA: Diagnosis not present

## 2020-02-05 DIAGNOSIS — M25561 Pain in right knee: Secondary | ICD-10-CM | POA: Diagnosis not present

## 2020-02-05 DIAGNOSIS — Z96651 Presence of right artificial knee joint: Secondary | ICD-10-CM | POA: Diagnosis not present

## 2020-02-05 DIAGNOSIS — M1711 Unilateral primary osteoarthritis, right knee: Secondary | ICD-10-CM | POA: Diagnosis not present

## 2020-02-05 DIAGNOSIS — M25661 Stiffness of right knee, not elsewhere classified: Secondary | ICD-10-CM | POA: Diagnosis not present

## 2020-02-07 DIAGNOSIS — Z96651 Presence of right artificial knee joint: Secondary | ICD-10-CM | POA: Diagnosis not present

## 2020-02-07 DIAGNOSIS — M1711 Unilateral primary osteoarthritis, right knee: Secondary | ICD-10-CM | POA: Diagnosis not present

## 2020-02-07 DIAGNOSIS — M25561 Pain in right knee: Secondary | ICD-10-CM | POA: Diagnosis not present

## 2020-02-07 DIAGNOSIS — M25661 Stiffness of right knee, not elsewhere classified: Secondary | ICD-10-CM | POA: Diagnosis not present

## 2020-02-15 DIAGNOSIS — M25561 Pain in right knee: Secondary | ICD-10-CM | POA: Diagnosis not present

## 2020-02-15 DIAGNOSIS — M25661 Stiffness of right knee, not elsewhere classified: Secondary | ICD-10-CM | POA: Diagnosis not present

## 2020-02-15 DIAGNOSIS — Z96651 Presence of right artificial knee joint: Secondary | ICD-10-CM | POA: Diagnosis not present

## 2020-02-15 DIAGNOSIS — M1711 Unilateral primary osteoarthritis, right knee: Secondary | ICD-10-CM | POA: Diagnosis not present

## 2020-02-16 DIAGNOSIS — Z471 Aftercare following joint replacement surgery: Secondary | ICD-10-CM | POA: Diagnosis not present

## 2020-02-16 DIAGNOSIS — Z96651 Presence of right artificial knee joint: Secondary | ICD-10-CM | POA: Diagnosis not present

## 2020-02-19 DIAGNOSIS — M25561 Pain in right knee: Secondary | ICD-10-CM | POA: Diagnosis not present

## 2020-02-19 DIAGNOSIS — Z96651 Presence of right artificial knee joint: Secondary | ICD-10-CM | POA: Diagnosis not present

## 2020-02-19 DIAGNOSIS — M25661 Stiffness of right knee, not elsewhere classified: Secondary | ICD-10-CM | POA: Diagnosis not present

## 2020-02-19 DIAGNOSIS — M1711 Unilateral primary osteoarthritis, right knee: Secondary | ICD-10-CM | POA: Diagnosis not present

## 2020-02-25 DIAGNOSIS — M25561 Pain in right knee: Secondary | ICD-10-CM | POA: Diagnosis not present

## 2020-02-25 DIAGNOSIS — Z96651 Presence of right artificial knee joint: Secondary | ICD-10-CM | POA: Diagnosis not present

## 2020-02-25 DIAGNOSIS — M25661 Stiffness of right knee, not elsewhere classified: Secondary | ICD-10-CM | POA: Diagnosis not present

## 2020-02-25 DIAGNOSIS — M1711 Unilateral primary osteoarthritis, right knee: Secondary | ICD-10-CM | POA: Diagnosis not present

## 2020-03-15 DIAGNOSIS — Z23 Encounter for immunization: Secondary | ICD-10-CM | POA: Diagnosis not present

## 2020-03-23 DIAGNOSIS — L821 Other seborrheic keratosis: Secondary | ICD-10-CM | POA: Diagnosis not present

## 2020-03-23 DIAGNOSIS — I8312 Varicose veins of left lower extremity with inflammation: Secondary | ICD-10-CM | POA: Diagnosis not present

## 2020-03-23 DIAGNOSIS — D2261 Melanocytic nevi of right upper limb, including shoulder: Secondary | ICD-10-CM | POA: Diagnosis not present

## 2020-03-23 DIAGNOSIS — D225 Melanocytic nevi of trunk: Secondary | ICD-10-CM | POA: Diagnosis not present

## 2020-03-29 DIAGNOSIS — M7051 Other bursitis of knee, right knee: Secondary | ICD-10-CM | POA: Diagnosis not present

## 2020-04-11 DIAGNOSIS — E039 Hypothyroidism, unspecified: Secondary | ICD-10-CM | POA: Diagnosis not present

## 2020-04-11 DIAGNOSIS — R531 Weakness: Secondary | ICD-10-CM | POA: Diagnosis not present

## 2020-04-11 DIAGNOSIS — R2689 Other abnormalities of gait and mobility: Secondary | ICD-10-CM | POA: Diagnosis not present

## 2020-04-11 DIAGNOSIS — Z79899 Other long term (current) drug therapy: Secondary | ICD-10-CM | POA: Diagnosis not present

## 2020-04-11 DIAGNOSIS — M25661 Stiffness of right knee, not elsewhere classified: Secondary | ICD-10-CM | POA: Diagnosis not present

## 2020-04-11 DIAGNOSIS — M25561 Pain in right knee: Secondary | ICD-10-CM | POA: Diagnosis not present

## 2020-04-19 DIAGNOSIS — M25661 Stiffness of right knee, not elsewhere classified: Secondary | ICD-10-CM | POA: Diagnosis not present

## 2020-04-19 DIAGNOSIS — M25561 Pain in right knee: Secondary | ICD-10-CM | POA: Diagnosis not present

## 2020-04-19 DIAGNOSIS — R531 Weakness: Secondary | ICD-10-CM | POA: Diagnosis not present

## 2020-04-19 DIAGNOSIS — R2689 Other abnormalities of gait and mobility: Secondary | ICD-10-CM | POA: Diagnosis not present

## 2020-04-26 DIAGNOSIS — R531 Weakness: Secondary | ICD-10-CM | POA: Diagnosis not present

## 2020-04-26 DIAGNOSIS — M25661 Stiffness of right knee, not elsewhere classified: Secondary | ICD-10-CM | POA: Diagnosis not present

## 2020-04-26 DIAGNOSIS — R2689 Other abnormalities of gait and mobility: Secondary | ICD-10-CM | POA: Diagnosis not present

## 2020-04-26 DIAGNOSIS — M25561 Pain in right knee: Secondary | ICD-10-CM | POA: Diagnosis not present

## 2020-04-29 DIAGNOSIS — I10 Essential (primary) hypertension: Secondary | ICD-10-CM | POA: Diagnosis not present

## 2020-04-29 DIAGNOSIS — Z23 Encounter for immunization: Secondary | ICD-10-CM | POA: Diagnosis not present

## 2020-04-29 DIAGNOSIS — E039 Hypothyroidism, unspecified: Secondary | ICD-10-CM | POA: Diagnosis not present

## 2020-05-03 DIAGNOSIS — R531 Weakness: Secondary | ICD-10-CM | POA: Diagnosis not present

## 2020-05-03 DIAGNOSIS — R2689 Other abnormalities of gait and mobility: Secondary | ICD-10-CM | POA: Diagnosis not present

## 2020-05-03 DIAGNOSIS — M25561 Pain in right knee: Secondary | ICD-10-CM | POA: Diagnosis not present

## 2020-05-03 DIAGNOSIS — M25661 Stiffness of right knee, not elsewhere classified: Secondary | ICD-10-CM | POA: Diagnosis not present

## 2020-07-29 DIAGNOSIS — M7051 Other bursitis of knee, right knee: Secondary | ICD-10-CM | POA: Diagnosis not present

## 2020-10-03 ENCOUNTER — Ambulatory Visit (INDEPENDENT_AMBULATORY_CARE_PROVIDER_SITE_OTHER): Payer: BC Managed Care – PPO

## 2020-10-03 ENCOUNTER — Encounter: Payer: Self-pay | Admitting: Emergency Medicine

## 2020-10-03 ENCOUNTER — Other Ambulatory Visit: Payer: Self-pay

## 2020-10-03 ENCOUNTER — Ambulatory Visit
Admission: EM | Admit: 2020-10-03 | Discharge: 2020-10-03 | Disposition: A | Discharge status: Home / Self Care | Payer: BC Managed Care – PPO | Attending: Family Medicine | Admitting: Family Medicine

## 2020-10-03 ENCOUNTER — Telehealth: Payer: Self-pay | Admitting: Emergency Medicine

## 2020-10-03 DIAGNOSIS — R0602 Shortness of breath: Secondary | ICD-10-CM

## 2020-10-03 DIAGNOSIS — J22 Unspecified acute lower respiratory infection: Secondary | ICD-10-CM

## 2020-10-03 MED ORDER — PROMETHAZINE-DM 6.25-15 MG/5ML PO SYRP: 5.0000 mL | ORAL_SOLUTION | Freq: Three times a day (TID) | ORAL | 0 refills | Status: DC | PRN

## 2020-10-03 MED ORDER — PREDNISONE 20 MG PO TABS: 40.0000 mg | ORAL_TABLET | Freq: Every day | ORAL | 0 refills | Status: DC

## 2020-10-03 MED ORDER — DOXYCYCLINE HYCLATE 100 MG PO CAPS: 100.0000 mg | ORAL_CAPSULE | Freq: Two times a day (BID) | ORAL | 0 refills | Status: DC

## 2020-10-03 NOTE — ED Provider Notes (Signed)
[facility]    CSN: [id number] Arrival date & time: 10/03/20  5573      History   Chief Complaint Chief Complaint  Patient presents with   Shortness of Breath    HPI [name] is a 60 y.o. female.   HPI Patient presents acute onset shortness of breath and sensation that something in the throat. Symptoms began this morning. She denies any known sick contacts. She endorses an occasional cough without production and chest tightness.. She is a febrile. Denies N&V or HA. Past Medical History:  Diagnosis Date   Anemia    prior to hysterectomy    Arthritis    right knee has injections last one is 01/12/2014   Cellulitis    hx of in left leg treated with keflex approx 6 weeks ago    Complication of anesthesia    Dysrhythmia    hx of palpitations 2009 prior to dx with thyroid    History of blood transfusion    History of kidney stones    Hypertension    Hyperthyroidism    Incomplete RBBB 2015   Noted on EKG   PONV (postoperative nausea and vomiting)    Stroke [facility])    1995    Patient Active Problem List   Diagnosis Date Noted   Osteoarthritis of right knee 01/06/2020   S/P total knee arthroplasty, right 01/06/2020   Graves' disease 01/14/2014   Hyperthyroidism 01/14/2014    Past Surgical History:  Procedure Laterality Date   ABDOMINAL HYSTERECTOMY     CARPAL TUNNEL RELEASE     right   colonscopy      polyps   CYSTOSCOPY WITH STENT PLACEMENT Right 01/22/2013   Procedure: CYSTOSCOPY RIGHT RETROGRADE PYLEOGRAM  RIGHT URETEROSCOPY, HOLMIUM LASER WITH RIGHT STENT PLACEMENT;  Surgeon: Ardis Hughs, MD;  Location: [facility];  Service: Urology;  Laterality: Right;   KNEE ARTHROPLASTY Right 01/06/2020   Procedure: COMPUTER ASSISTED TOTAL KNEE ARTHROPLASTY;  Surgeon: Rod Can, MD;  Location: [facility];  Service: Orthopedics;  Laterality: Right;   KNEE SURGERY     torn cartilage per right knee 1976   THYROIDECTOMY N/A 01/14/2014   Procedure: TOTAL  THYROIDECTOMY;  Surgeon: Armandina Gemma, MD;  Location: [facility];  Service: General;  Laterality: N/A;   TONSILLECTOMY      OB History   No obstetric history on file.      Home Medications    Prior to Admission medications   Medication Sig Start Date End Date Taking? Authorizing Provider  atorvastatin (LIPITOR) 10 MG tablet Take 10 mg by mouth every evening.     [provider]  clopidogrel (PLAVIX) 75 MG tablet Take 75 mg by mouth every evening.     [provider]  doxycycline (VIBRAMYCIN) 100 MG capsule Take 1 capsule (100 mg total) by mouth 2 (two) times daily. 10/03/20   Scot Jun, FNP  escitalopram (LEXAPRO) 10 MG tablet Take 10 mg by mouth daily.    [provider]  ibuprofen (ADVIL,MOTRIN) 800 MG tablet Take 1 tablet (800 mg total) by mouth every 8 (eight) hours as needed. Patient taking differently: Take 800 mg by mouth every evening.  01/15/14   Armandina Gemma, MD  levothyroxine (SYNTHROID) 100 MCG tablet Take 100 mcg by mouth daily before breakfast.    [provider]  losartan-hydrochlorothiazide (HYZAAR) 100-12.5 MG tablet Take 1 tablet by mouth every evening.    [provider]  ondansetron (ZOFRAN) 4 MG tablet Take  1 tablet (4 mg total) by mouth every 8 (eight) hours as needed for nausea or vomiting. 01/06/20   Swinteck, Aaron Edelman, MD  oxyCODONE (ROXICODONE) 5 MG immediate release tablet Take 1 tablet (5 mg total) by mouth every 4 (four) hours as needed for severe pain. 01/06/20   Swinteck, Aaron Edelman, MD  predniSONE (DELTASONE) 20 MG tablet Take 2 tablets (40 mg total) by mouth daily with breakfast. 10/03/20   Scot Jun, FNP  promethazine-dextromethorphan (PROMETHAZINE-DM) 6.25-15 MG/5ML syrup Take 5 mLs by mouth 3 (three) times daily as needed for cough. 10/03/20   Scot Jun, FNP  propranolol (INDERAL) 40 MG tablet Take 1 tablet (40 mg total) by mouth at bedtime. Patient not taking: Reported on 12/17/2019 01/15/14    Armandina Gemma, MD    Family History Family History  Problem Relation Age of Onset   Breast cancer Sister    Breast cancer Maternal Aunt    Breast cancer Maternal Aunt     Social History Social History   Tobacco Use   Smoking status: Passive Smoke Exposure - Never Smoker   Smokeless tobacco: Never  Vaping Use   Vaping Use: Never used  Substance Use Topics   Alcohol use: No   Drug use: No     Allergies   Shrimp [shellfish allergy] and Lisinopril   Review of Systems Review of Systems Pertinent negatives listed in HPI   Physical Exam Triage Vital Signs ED Triage Vitals  Enc Vitals Group     BP 10/03/20 0848 (!) 154/86     Pulse Rate 10/03/20 0848 82     Resp 10/03/20 0848 16     Temp 10/03/20 0848 98.3 F (36.8 C)     Temp Source 10/03/20 0848 Tympanic     SpO2 10/03/20 0848 94 %     Weight --      Height --      Head Circumference --      Peak Flow --      Pain Score 10/03/20 0850 0     Pain Loc --      Pain Edu? --      Excl. in [place]? --    No data found.  Updated Vital Signs BP (!) 154/86 (BP Location: Right Arm)   Pulse 82   Temp 98.3 F (36.8 C) (Tympanic)   Resp 16   SpO2 94%   Visual Acuity Right Eye Distance:   Left Eye Distance:   Bilateral Distance:    Right Eye Near:   Left Eye Near:    Bilateral Near:     Physical Exam General appearance: alert, Ill-appearing, no distress Head: Normocephalic, without obvious abnormality, atraumatic ENT: Ears (B/L) normal, nares w/ congestion, non-erythematous oropharynx w/o exudate Respiratory: Respirations even , unlabored, coarse lung sound, expiratory wheeze Heart: Rate and rhythm normal. No gallop or murmurs noted on exam  Abdomen: BS +, no distention, no rebound tenderness, or no mass Extremities: No gross deformities Skin: Skin color, texture, turgor normal. No rashes seen  Psych: Appropriate mood and affect.    UC Treatments / Results  Labs (all labs ordered are listed, but only  abnormal results are displayed) Labs Reviewed  COVID-19, FLU A+B NAA   Narrative:    Performed at:  [address] [place], [place]  [id number] Lab Director: Rush Farmer MD, Phone:  [phone]    EKG   Radiology No results found.  Procedures Procedures (including critical care time)  Medications Ordered in  UC Medications - No data to display  Initial Impression / Assessment and Plan / UC Course  I have reviewed the triage vital signs and the nursing notes.  Pertinent labs & imaging results that were available during my care of the patient were reviewed by me and considered in my medical decision making (see chart for details).     CXR unremarkable. Treating for a lower respiratory infection. Treatment per discharge medication. Strict ER precautions if symptoms worsen or return if symptoms do not improve.  COVID/FLU test pending.   Final Clinical Impressions(s) / UC Diagnoses   Final diagnoses:  Shortness of breath  Lower respiratory infection (e.g., bronchitis, pneumonia, pneumonitis, pulmonitis)     Discharge Instructions      Your COVID 19 results should result within 2-4 days. Negative results are immediately resulted to Mychart. Positive results will receive a follow-up call from our clinic. If symptoms are present, I recommend home quarantine until results are known.  Alternate Tylenol and ibuprofen as needed for body aches and fever.  Symptom management per recommendations discussed today.  If any breathing difficulty or chest pain develops go immediately to the closest emergency department for evaluation.      ED Prescriptions     Medication Sig Dispense Auth. Provider   promethazine-dextromethorphan (PROMETHAZINE-DM) 6.25-15 MG/5ML syrup Take 5 mLs by mouth 3 (three) times daily as needed for cough. 140 mL Scot Jun, FNP   doxycycline (VIBRAMYCIN) 100 MG capsule Take 1 capsule (100 mg total) by mouth 2 (two) times daily.  20 capsule Scot Jun, FNP   predniSONE (DELTASONE) 20 MG tablet Take 2 tablets (40 mg total) by mouth daily with breakfast. 10 tablet Scot Jun, FNP      PDMP not reviewed this encounter.   Scot Jun, [place] 10/07/20 1322

## 2020-10-03 NOTE — Discharge Instructions (Addendum)
Your COVID 19 results should result within 2-4 days. Negative results are immediately resulted to Mychart. Positive results will receive a follow-up call from our clinic. If symptoms are present, I recommend home quarantine until results are known.  Alternate Tylenol and ibuprofen as needed for body aches and fever.  Symptom management per recommendations discussed today.  If any breathing difficulty or chest pain develops go immediately to the closest emergency department for evaluation.

## 2020-10-03 NOTE — ED Triage Notes (Signed)
Pt states she woke up this morning and felt like she couldn't breath.  States she feels like something is in her throat. Breathing feels better since she woke up.

## 2020-10-04 LAB — COVID-19, FLU A+B NAA
Influenza A, NAA: NOT DETECTED
Influenza B, NAA: NOT DETECTED
SARS-CoV-2, NAA: NOT DETECTED

## 2020-11-08 DIAGNOSIS — Z79899 Other long term (current) drug therapy: Secondary | ICD-10-CM | POA: Diagnosis not present

## 2020-11-08 DIAGNOSIS — E039 Hypothyroidism, unspecified: Secondary | ICD-10-CM | POA: Diagnosis not present

## 2020-11-08 DIAGNOSIS — I10 Essential (primary) hypertension: Secondary | ICD-10-CM | POA: Diagnosis not present

## 2020-11-08 DIAGNOSIS — E785 Hyperlipidemia, unspecified: Secondary | ICD-10-CM | POA: Diagnosis not present

## 2020-11-17 ENCOUNTER — Other Ambulatory Visit: Payer: Self-pay | Admitting: Internal Medicine

## 2020-11-17 DIAGNOSIS — Z1231 Encounter for screening mammogram for malignant neoplasm of breast: Secondary | ICD-10-CM

## 2020-11-18 DIAGNOSIS — E785 Hyperlipidemia, unspecified: Secondary | ICD-10-CM | POA: Diagnosis not present

## 2020-11-18 DIAGNOSIS — I10 Essential (primary) hypertension: Secondary | ICD-10-CM | POA: Diagnosis not present

## 2020-11-18 DIAGNOSIS — F419 Anxiety disorder, unspecified: Secondary | ICD-10-CM | POA: Diagnosis not present

## 2020-11-18 DIAGNOSIS — R062 Wheezing: Secondary | ICD-10-CM | POA: Diagnosis not present

## 2020-11-24 ENCOUNTER — Institutional Professional Consult (permissible substitution): Payer: BC Managed Care – PPO | Admitting: Student

## 2020-12-07 NOTE — Progress Notes (Signed)
Synopsis: Referred for wheezing by Asencion Noble, MD  Subjective:   PATIENT ID: [name] GENDER: female DOB: [birth year], MRN: [id number]  Chief Complaint  Patient presents with   Consult    Patient reports that she will eating or drinking cucumbers, and feels that she is gasping for air.    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  She says that certain foods like canteloupe or cucumbers or sprite would trigger stridulous breath sounds. She tries sniff-hiss technique.   Seen in ED 10/03/20 for acute dyspnea that woke her up in the middle of the night, sensation of something stuck in throat. Given course of prednisone and doxycycline. Albuterol was also helpful at the time.  Has never done speech therapy.She has no solid or liquid dysphagia. Never had asthma as a kid. No postnasal drainage/sinonasal congestion. Did just start to have reflux.   She has been seen by ENT at [facility] 12/2017 for dysphagia following total thyroidectomy in 2015. At that visit left vocal fold paralysis not seen and instead thought to have laryngospasm, recommended sniff-hiss technique. FEES showed normal OP swallowing.  Otherwise pertinent review of systems is negative.  Dad had COPD  She works as an Optometrist. No dust/solvent/particulate exposures without a mask. She has cats at home. No pet birds. No hot tub at home.    Past Medical History:  Diagnosis Date   Anemia    prior to hysterectomy    Arthritis    right knee has injections last one is 01/12/2014   Cellulitis    hx of in left leg treated with keflex approx 6 weeks ago    Complication of anesthesia    Dysrhythmia    hx of palpitations 2009 prior to dx with thyroid    History of blood transfusion    History of kidney stones    Hypertension    Hyperthyroidism    Incomplete RBBB 2015    Noted on EKG   PONV (postoperative nausea and vomiting)    Stroke ([place])    1995     Family History  Problem Relation Age of Onset   Diabetes Father    COPD Father    Breast cancer Sister    Breast cancer Maternal Aunt    Breast cancer Maternal Aunt      Past Surgical History:  Procedure Laterality Date   ABDOMINAL HYSTERECTOMY     CARPAL TUNNEL RELEASE     right   colonscopy      polyps   CYSTOSCOPY WITH STENT PLACEMENT Right 01/22/2013   Procedure: CYSTOSCOPY RIGHT RETROGRADE PYLEOGRAM  RIGHT URETEROSCOPY, HOLMIUM LASER WITH RIGHT STENT PLACEMENT;  Surgeon: Ardis Hughs, MD;  Location: [facility];  Service: Urology;  Laterality: Right;   KNEE ARTHROPLASTY Right 01/06/2020   Procedure: COMPUTER ASSISTED TOTAL KNEE ARTHROPLASTY;  Surgeon: Rod Can, MD;  Location: [facility];  Service: Orthopedics;  Laterality: Right;   KNEE SURGERY     torn cartilage per right knee 1976   THYROIDECTOMY N/A 01/14/2014   Procedure: TOTAL THYROIDECTOMY;  Surgeon: Armandina Gemma, MD;  Location: [facility];  Service: General;  Laterality: N/A;   TONSILLECTOMY      Social History   Socioeconomic History   Marital status: Married    Spouse name: Not on file   Number of children: Not on file   Years of  education: Not on file   Highest education level: Not on file  Occupational History   Not on file  Tobacco Use   Smoking status: Never    Passive exposure: Yes   Smokeless tobacco: Never  Vaping Use   Vaping Use: Never used  Substance and Sexual Activity   Alcohol use: No   Drug use: No   Sexual activity: Yes    Birth control/protection: Surgical  Other Topics Concern   Not on file  Social History Narrative   Not on file   Social Determinants of Health   Financial Resource Strain: Not on file  Food Insecurity: Not on file  Transportation Needs: Not on file  Physical Activity: Not on file  Stress: Not on file  Social Connections: Not on file  Intimate Partner Violence: Not on file      Allergies  Allergen Reactions   Shrimp [Shellfish Allergy] Anaphylaxis   Lisinopril Cough     Outpatient Medications Prior to Visit  Medication Sig Dispense Refill   atorvastatin (LIPITOR) 10 MG tablet Take 10 mg by mouth every evening.      clopidogrel (PLAVIX) 75 MG tablet Take 75 mg by mouth every evening.      escitalopram (LEXAPRO) 20 MG tablet Take 20 mg by mouth daily.     losartan-hydrochlorothiazide (HYZAAR) 100-12.5 MG tablet Take 1 tablet by mouth every evening.     SYNTHROID 112 MCG tablet Take 112 mcg by mouth daily.     ibuprofen (ADVIL,MOTRIN) 800 MG tablet Take 1 tablet (800 mg total) by mouth every 8 (eight) hours as needed. (Patient taking differently: Take 800 mg by mouth every evening.) 30 tablet 0   levothyroxine (SYNTHROID) 100 MCG tablet Take 100 mcg by mouth daily before breakfast.     amoxicillin (AMOXIL) 500 MG capsule SMARTSIG:4 Capsule(s) By Mouth     doxycycline (VIBRAMYCIN) 100 MG capsule Take 1 capsule (100 mg total) by mouth 2 (two) times daily. 20 capsule 0   escitalopram (LEXAPRO) 10 MG tablet Take 10 mg by mouth daily. (Patient not taking: Reported on 12/08/2020)     ondansetron (ZOFRAN) 4 MG tablet Take 1 tablet (4 mg total) by mouth every 8 (eight) hours as needed for nausea or vomiting. 20 tablet 0   oxyCODONE (ROXICODONE) 5 MG immediate release tablet Take 1 tablet (5 mg total) by mouth every 4 (four) hours as needed for severe pain. 42 tablet 0   predniSONE (DELTASONE) 20 MG tablet Take 2 tablets (40 mg total) by mouth daily with breakfast. 10 tablet 0   promethazine-dextromethorphan (PROMETHAZINE-DM) 6.25-15 MG/5ML syrup Take 5 mLs by mouth 3 (three) times daily as needed for cough. 140 mL 0   propranolol (INDERAL) 40 MG tablet Take 1 tablet (40 mg total) by mouth at bedtime. (Patient not taking: Reported on 12/17/2019) 30 tablet 0   No facility-administered medications prior to visit.       Objective:   Physical Exam:  General  appearance: 59 y.o., female, NAD, conversant  Eyes: anicteric sclerae, moist conjunctivae; no lid-lag; PERRL, tracking appropriately HENT: NCAT; oropharynx, MMM, no mucosal ulcerations; normal hard and soft palate Neck: Trachea midline; no lymphadenopathy, no JVD Lungs: inspiratory stridor heard best over neck, quieter moving down the posterior lung fields away from the neck, no crackles, no wheeze, with normal respiratory effort CV: RRR, no MRGs  Abdomen: Soft, non-tender; non-distended, BS present  Extremities: No peripheral edema, radial and DP pulses present bilaterally  Skin: Normal temperature, turgor and  texture; no rash Psych: Appropriate affect Neuro: Alert and oriented to person and place, no focal deficit    Vitals:   12/08/20 1605  BP: 120/80  Pulse: 72  Temp: 98.5 F (36.9 C)  TempSrc: Oral  SpO2: 95%  Weight: 251 lb 9.6 oz (114.1 kg)  Height: 5\' 2"  (1.575 m)   95% on RA BMI Readings from Last 3 Encounters:  12/08/20 46.02 kg/m  01/06/20 42.95 kg/m  12/29/19 44.91 kg/m   Wt Readings from Last 3 Encounters:  12/08/20 251 lb 9.6 oz (114.1 kg)  01/06/20 234 lb 12.8 oz (106.5 kg)  12/29/19 245 lb 9 oz (111.4 kg)     CBC    Component Value Date/Time   WBC 8.0 01/07/2020 0306   RBC 3.44 (L) 01/07/2020 0306   HGB 10.8 (L) 01/07/2020 0306   HCT 31.8 (L) 01/07/2020 0306   PLT 201 01/07/2020 0306   MCV 92.4 01/07/2020 0306   MCH 31.4 01/07/2020 0306   MCHC 34.0 01/07/2020 0306   RDW 12.6 01/07/2020 0306   LYMPHSABS 0.8 01/21/2013 1122   MONOABS 0.7 01/21/2013 1122   EOSABS 0.0 01/21/2013 1122   BASOSABS 0.0 01/21/2013 1122      Chest Imaging: CXR 10/03/20 reviewed by me and unremarkable  Pulmonary Functions Testing Results: No flowsheet data found.    Echocardiogram:   TTE 2009 normal      Assessment & Plan:   # Inspiratory stridor # laryngospasm # history of thyroidectomy # history of vocal cord paralysis  Does have history of  laryngospasm. It could be that she has a lower threshold for symptoms in setting of now symptomatic GERD and LPR, irritable larynx/laryngeal edema. Did have some response to albuterol, steroid so perhaps she's developed asthma as well and just notices her laryngospasm/VCD more when she has uncontrolled asthma although I don't hear any convincing lower airway wheezing on exam currently. Another possibility would be subglottic stenosis/central airway obstruction below level of vocal cords.  Plan: - PFTs - ct neck - start protonix 40 mg daily 30 min before breakfast - referral to ENT to re-establish care      Maryjane Hurter, MD [facility] 12/08/2020 4:23 PM

## 2020-12-08 ENCOUNTER — Encounter: Payer: Self-pay | Admitting: Student

## 2020-12-08 ENCOUNTER — Ambulatory Visit: Payer: BC Managed Care – PPO | Admitting: Student

## 2020-12-08 ENCOUNTER — Other Ambulatory Visit: Payer: Self-pay

## 2020-12-08 VITALS — BP 120/80 | HR 72 | Temp 98.5°F | Ht 62.0 in | Wt 251.6 lb

## 2020-12-08 DIAGNOSIS — R061 Stridor: Secondary | ICD-10-CM | POA: Diagnosis not present

## 2020-12-08 DIAGNOSIS — J385 Laryngeal spasm: Secondary | ICD-10-CM

## 2020-12-08 MED ORDER — PANTOPRAZOLE SODIUM 40 MG PO TBEC: 40.0000 mg | DELAYED_RELEASE_TABLET | Freq: Every day | ORAL | 1 refills | Status: AC

## 2020-12-08 NOTE — Patient Instructions (Signed)
-   pantoprazole 40 mg daily 30 minutes before breakfast everyday - ct neck  - breathing tests  - ENT referral - follow up tentatively in 4 months

## 2020-12-19 ENCOUNTER — Ambulatory Visit (HOSPITAL_COMMUNITY)
Admission: RE | Admit: 2020-12-19 | Discharge: 2020-12-19 | Disposition: A | Discharge status: Home / Self Care | Payer: BC Managed Care – PPO | Source: Ambulatory Visit | Attending: Student | Admitting: Student

## 2020-12-19 ENCOUNTER — Other Ambulatory Visit: Payer: Self-pay

## 2020-12-19 DIAGNOSIS — J392 Other diseases of pharynx: Secondary | ICD-10-CM | POA: Diagnosis not present

## 2020-12-19 DIAGNOSIS — R061 Stridor: Secondary | ICD-10-CM | POA: Diagnosis not present

## 2020-12-19 DIAGNOSIS — E890 Postprocedural hypothyroidism: Secondary | ICD-10-CM | POA: Diagnosis not present

## 2020-12-19 DIAGNOSIS — J385 Laryngeal spasm: Secondary | ICD-10-CM | POA: Diagnosis not present

## 2020-12-19 DIAGNOSIS — J3800 Paralysis of vocal cords and larynx, unspecified: Secondary | ICD-10-CM | POA: Diagnosis not present

## 2020-12-23 NOTE — Telephone Encounter (Signed)
NM please advise. Thanks   Dr. Verlee Monte, With the results of the CT Scan of the neck, should I put off the breathing tests and just see Dr. Joya Gaskins w/ [facility]?  Earliest available appointment with Dr. Joya Gaskins is 01/30/21.   [place]

## 2020-12-23 NOTE — Telephone Encounter (Signed)
Called and spoke with patient about CT neck results. Let's hold off on PFTs at least until our next clinic visit after we've had a chance to see what ENT suggests.   I tried to cancel the order for PFT but it looks like I can't since it's already been scheduled. Is there anything I need to do to cancel the order?  Thanks!

## 2020-12-23 NOTE — Telephone Encounter (Signed)
NM  FYI  I have cancelled the PFT for the pt.  Nothing further is needed.

## 2020-12-30 ENCOUNTER — Ambulatory Visit: Payer: BC Managed Care – PPO

## 2021-01-18 ENCOUNTER — Institutional Professional Consult (permissible substitution): Payer: BC Managed Care – PPO | Admitting: Internal Medicine

## 2021-01-20 ENCOUNTER — Ambulatory Visit
Admission: RE | Admit: 2021-01-20 | Discharge: 2021-01-20 | Disposition: A | Discharge status: Home / Self Care | Payer: BC Managed Care – PPO | Source: Ambulatory Visit | Attending: Internal Medicine | Admitting: Internal Medicine

## 2021-01-20 DIAGNOSIS — Z1231 Encounter for screening mammogram for malignant neoplasm of breast: Secondary | ICD-10-CM

## 2021-01-24 ENCOUNTER — Other Ambulatory Visit: Payer: Self-pay | Admitting: Internal Medicine

## 2021-01-24 DIAGNOSIS — R928 Other abnormal and inconclusive findings on diagnostic imaging of breast: Secondary | ICD-10-CM

## 2021-01-30 ENCOUNTER — Institutional Professional Consult (permissible substitution): Payer: BC Managed Care – PPO | Admitting: Pulmonary Disease

## 2021-01-30 DIAGNOSIS — J3801 Paralysis of vocal cords and larynx, unilateral: Secondary | ICD-10-CM | POA: Diagnosis not present

## 2021-01-30 DIAGNOSIS — J385 Laryngeal spasm: Secondary | ICD-10-CM | POA: Diagnosis not present

## 2021-01-30 DIAGNOSIS — J384 Edema of larynx: Secondary | ICD-10-CM | POA: Diagnosis not present

## 2021-01-30 DIAGNOSIS — J383 Other diseases of vocal cords: Secondary | ICD-10-CM | POA: Diagnosis not present

## 2021-02-17 ENCOUNTER — Ambulatory Visit
Admission: RE | Admit: 2021-02-17 | Discharge: 2021-02-17 | Disposition: A | Discharge status: Home / Self Care | Payer: BC Managed Care – PPO | Source: Ambulatory Visit | Attending: Internal Medicine | Admitting: Internal Medicine

## 2021-02-17 DIAGNOSIS — E0500 Thyrotoxicosis with diffuse goiter without thyrotoxic crisis or storm: Secondary | ICD-10-CM | POA: Diagnosis not present

## 2021-02-17 DIAGNOSIS — N6001 Solitary cyst of right breast: Secondary | ICD-10-CM | POA: Diagnosis not present

## 2021-02-17 DIAGNOSIS — R928 Other abnormal and inconclusive findings on diagnostic imaging of breast: Secondary | ICD-10-CM

## 2021-02-17 DIAGNOSIS — R922 Inconclusive mammogram: Secondary | ICD-10-CM | POA: Diagnosis not present

## 2021-02-24 DIAGNOSIS — F419 Anxiety disorder, unspecified: Secondary | ICD-10-CM | POA: Diagnosis not present

## 2021-02-24 DIAGNOSIS — E039 Hypothyroidism, unspecified: Secondary | ICD-10-CM | POA: Diagnosis not present

## 2021-02-24 DIAGNOSIS — Z23 Encounter for immunization: Secondary | ICD-10-CM | POA: Diagnosis not present

## 2021-04-19 DIAGNOSIS — L853 Xerosis cutis: Secondary | ICD-10-CM | POA: Diagnosis not present

## 2021-04-19 DIAGNOSIS — Q825 Congenital non-neoplastic nevus: Secondary | ICD-10-CM | POA: Diagnosis not present

## 2021-04-19 DIAGNOSIS — L820 Inflamed seborrheic keratosis: Secondary | ICD-10-CM | POA: Diagnosis not present

## 2021-04-19 DIAGNOSIS — L821 Other seborrheic keratosis: Secondary | ICD-10-CM | POA: Diagnosis not present

## 2021-04-19 DIAGNOSIS — L918 Other hypertrophic disorders of the skin: Secondary | ICD-10-CM | POA: Diagnosis not present

## 2021-04-24 DIAGNOSIS — Z01419 Encounter for gynecological examination (general) (routine) without abnormal findings: Secondary | ICD-10-CM | POA: Diagnosis not present

## 2021-04-24 DIAGNOSIS — Z1389 Encounter for screening for other disorder: Secondary | ICD-10-CM | POA: Diagnosis not present

## 2021-05-19 DIAGNOSIS — E039 Hypothyroidism, unspecified: Secondary | ICD-10-CM | POA: Diagnosis not present

## 2021-05-26 DIAGNOSIS — E039 Hypothyroidism, unspecified: Secondary | ICD-10-CM | POA: Diagnosis not present

## 2021-05-26 DIAGNOSIS — N390 Urinary tract infection, site not specified: Secondary | ICD-10-CM | POA: Diagnosis not present

## 2021-11-06 DIAGNOSIS — N3000 Acute cystitis without hematuria: Secondary | ICD-10-CM | POA: Diagnosis not present

## 2021-11-13 ENCOUNTER — Other Ambulatory Visit: Payer: Self-pay | Admitting: Internal Medicine

## 2021-11-13 DIAGNOSIS — Z1231 Encounter for screening mammogram for malignant neoplasm of breast: Secondary | ICD-10-CM

## 2021-11-14 DIAGNOSIS — H43813 Vitreous degeneration, bilateral: Secondary | ICD-10-CM | POA: Diagnosis not present

## 2021-11-14 DIAGNOSIS — H2513 Age-related nuclear cataract, bilateral: Secondary | ICD-10-CM | POA: Diagnosis not present

## 2021-11-14 DIAGNOSIS — H04123 Dry eye syndrome of bilateral lacrimal glands: Secondary | ICD-10-CM | POA: Diagnosis not present

## 2021-11-24 DIAGNOSIS — G459 Transient cerebral ischemic attack, unspecified: Secondary | ICD-10-CM | POA: Diagnosis not present

## 2021-11-24 DIAGNOSIS — E039 Hypothyroidism, unspecified: Secondary | ICD-10-CM | POA: Diagnosis not present

## 2021-11-24 DIAGNOSIS — I10 Essential (primary) hypertension: Secondary | ICD-10-CM | POA: Diagnosis not present

## 2021-11-24 DIAGNOSIS — F419 Anxiety disorder, unspecified: Secondary | ICD-10-CM | POA: Diagnosis not present

## 2021-11-24 DIAGNOSIS — Z79899 Other long term (current) drug therapy: Secondary | ICD-10-CM | POA: Diagnosis not present

## 2021-12-01 DIAGNOSIS — F411 Generalized anxiety disorder: Secondary | ICD-10-CM | POA: Diagnosis not present

## 2021-12-01 DIAGNOSIS — Z8673 Personal history of transient ischemic attack (TIA), and cerebral infarction without residual deficits: Secondary | ICD-10-CM | POA: Diagnosis not present

## 2021-12-01 DIAGNOSIS — E039 Hypothyroidism, unspecified: Secondary | ICD-10-CM | POA: Diagnosis not present

## 2021-12-01 DIAGNOSIS — E785 Hyperlipidemia, unspecified: Secondary | ICD-10-CM | POA: Diagnosis not present

## 2022-01-22 DIAGNOSIS — Z23 Encounter for immunization: Secondary | ICD-10-CM | POA: Diagnosis not present

## 2022-01-26 ENCOUNTER — Other Ambulatory Visit: Payer: Self-pay | Admitting: Internal Medicine

## 2022-01-26 ENCOUNTER — Ambulatory Visit
Admission: RE | Admit: 2022-01-26 | Discharge: 2022-01-26 | Disposition: A | Discharge status: Home / Self Care | Payer: BC Managed Care – PPO | Source: Ambulatory Visit | Attending: Internal Medicine | Admitting: Internal Medicine

## 2022-01-26 DIAGNOSIS — N644 Mastodynia: Secondary | ICD-10-CM

## 2022-01-26 DIAGNOSIS — Z1231 Encounter for screening mammogram for malignant neoplasm of breast: Secondary | ICD-10-CM

## 2022-01-31 ENCOUNTER — Ambulatory Visit
Admission: RE | Admit: 2022-01-31 | Discharge: 2022-01-31 | Disposition: A | Discharge status: Home / Self Care | Payer: BC Managed Care – PPO | Source: Ambulatory Visit | Attending: Internal Medicine | Admitting: Internal Medicine

## 2022-01-31 DIAGNOSIS — N644 Mastodynia: Secondary | ICD-10-CM

## 2022-07-16 DIAGNOSIS — Z79899 Other long term (current) drug therapy: Secondary | ICD-10-CM | POA: Diagnosis not present

## 2022-07-16 DIAGNOSIS — E039 Hypothyroidism, unspecified: Secondary | ICD-10-CM | POA: Diagnosis not present

## 2022-08-20 IMAGING — MG DIGITAL SCREENING BILAT W/ TOMO W/ CAD
8 series · 8 of 24 positions shown · non-contrast
Comparison: Previous exam(s).

ACR Breast Density Category a: The breast tissue is almost entirely
fatty.

CLINICAL DATA: Screening.

EXAM:
DIGITAL SCREENING BILATERAL MAMMOGRAM WITH TOMO AND CAD

[L CC synth-2D]
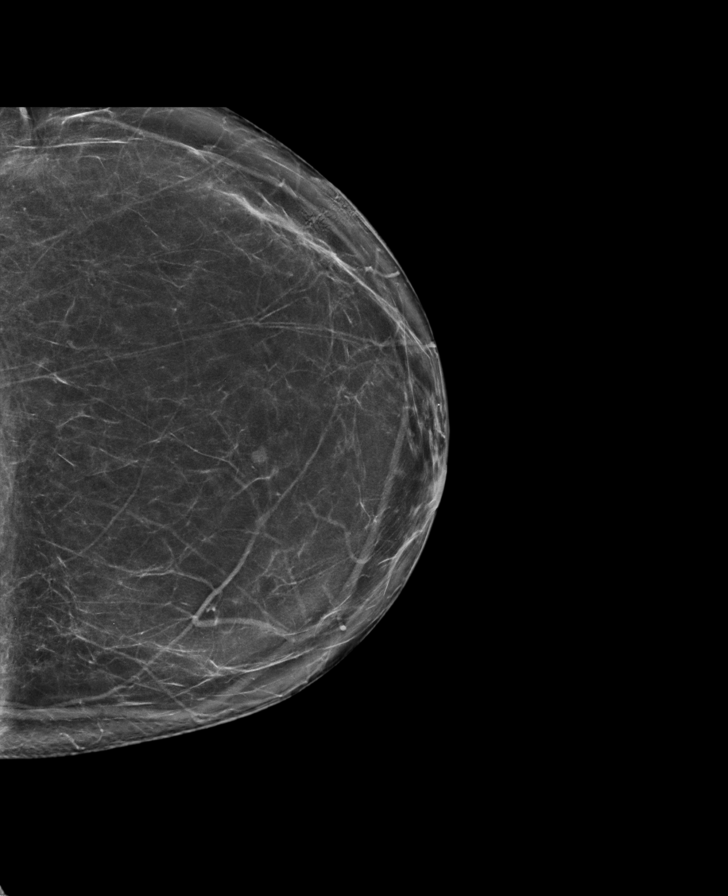

[R MLO synth-2D]
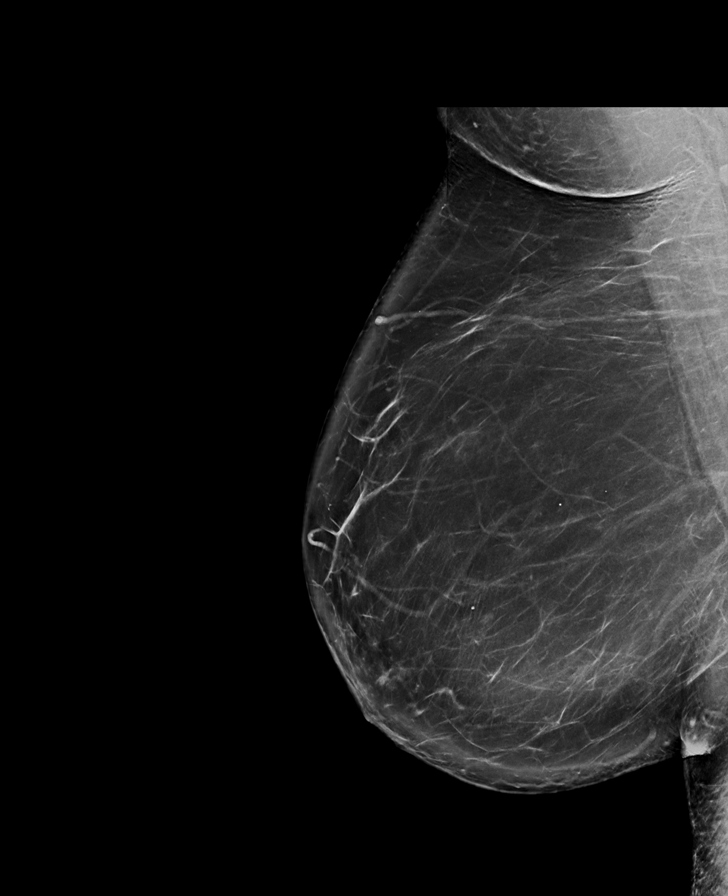

[R CC synth-2D]
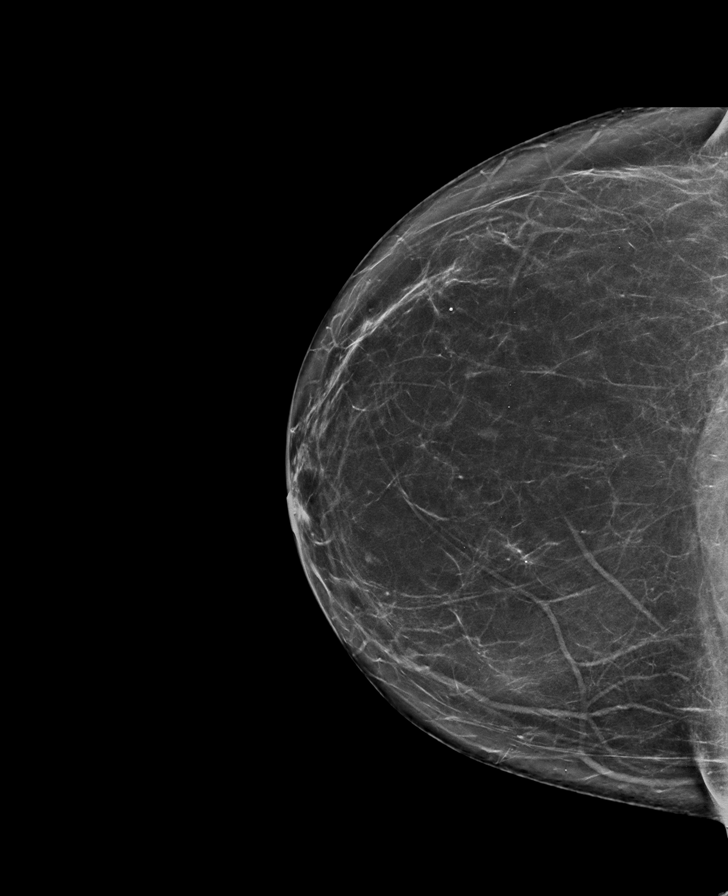

[L MLO synth-2D]
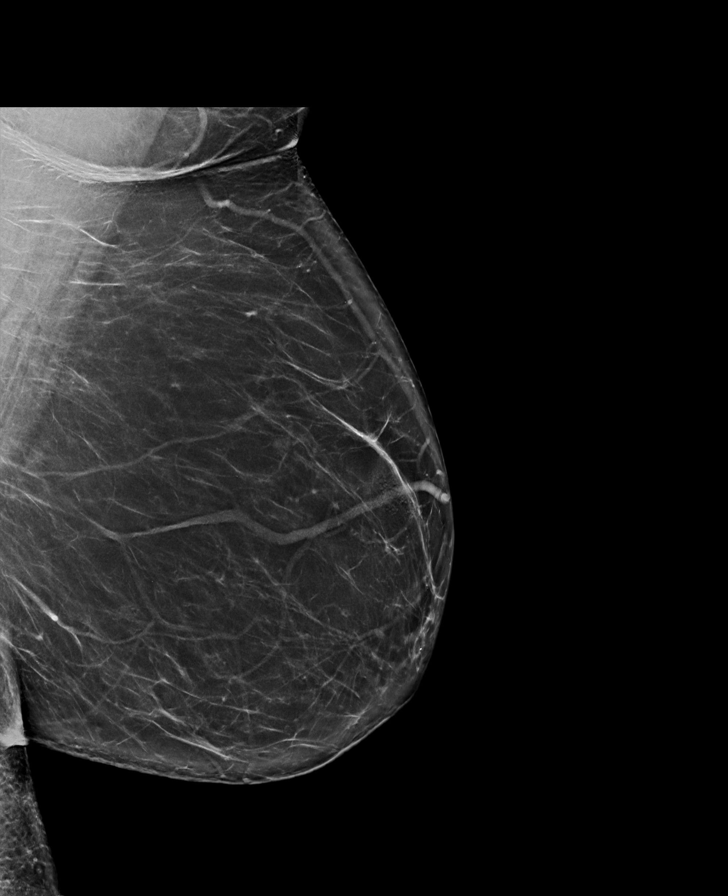

[L MLO tomo · tomo slice 43/84.0]
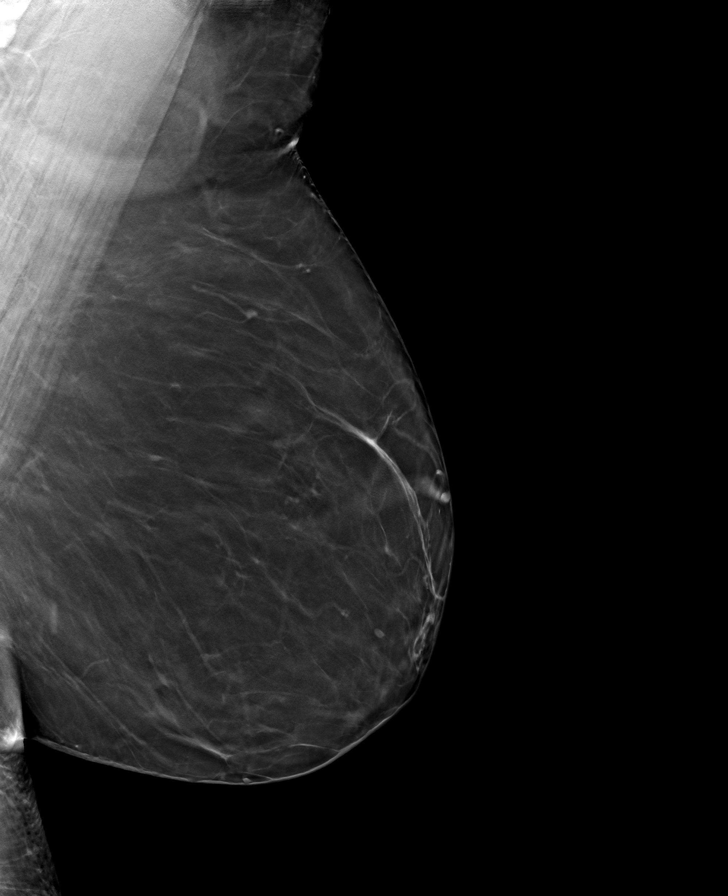

[R MLO tomo · tomo slice 51/100.0]
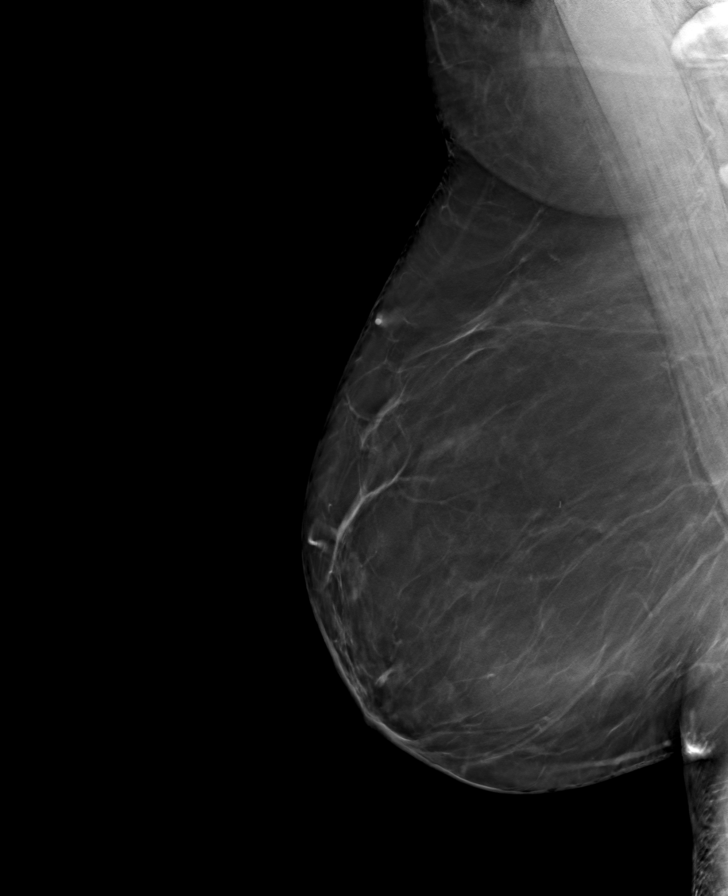

[L CC tomo · tomo slice 39/77.0]
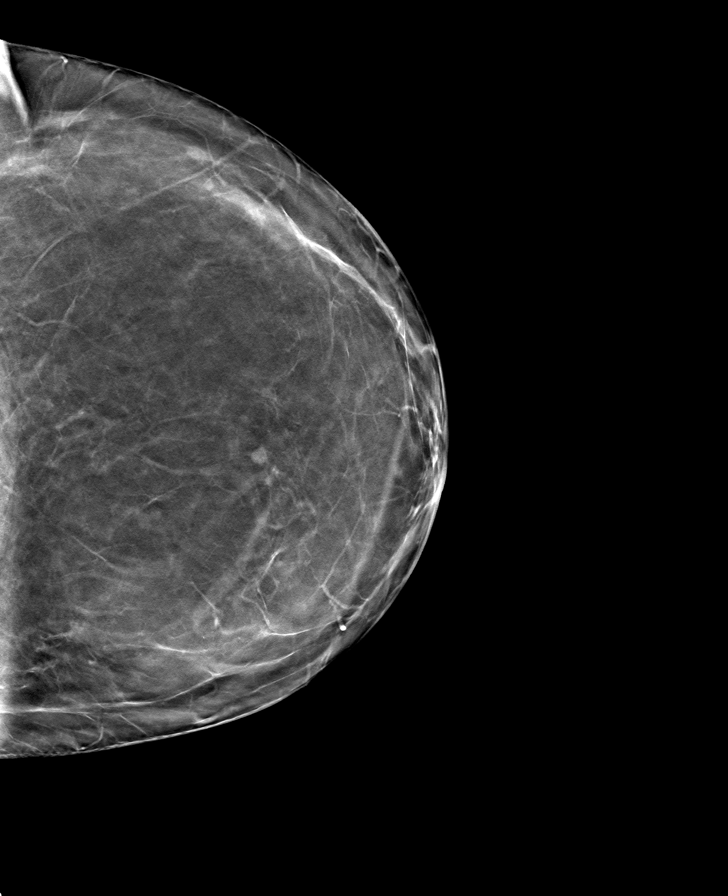

[R CC tomo · tomo slice 40/79.0]
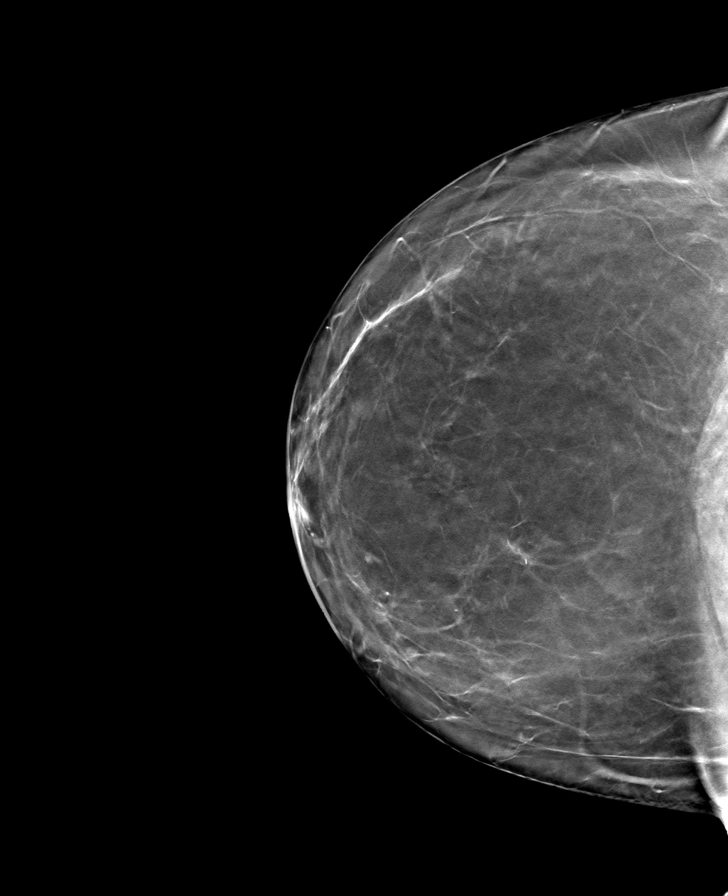

[8 of 24 positions shown; findings below may reference images not displayed]

FINDINGS: There are no findings suspicious for malignancy. Images were
processed with CAD.
IMPRESSION: No mammographic evidence of malignancy. A result letter of this
screening mammogram will be mailed directly to the patient.

RECOMMENDATION:
Screening mammogram in one year. (Code:[id number]-[id number])

BI-RADS CATEGORY  1: Negative.

## 2022-09-18 DIAGNOSIS — D225 Melanocytic nevi of trunk: Secondary | ICD-10-CM | POA: Diagnosis not present

## 2022-09-18 DIAGNOSIS — D224 Melanocytic nevi of scalp and neck: Secondary | ICD-10-CM | POA: Diagnosis not present

## 2022-09-18 DIAGNOSIS — L821 Other seborrheic keratosis: Secondary | ICD-10-CM | POA: Diagnosis not present

## 2022-09-18 DIAGNOSIS — L718 Other rosacea: Secondary | ICD-10-CM | POA: Diagnosis not present

## 2022-12-26 ENCOUNTER — Other Ambulatory Visit: Payer: Self-pay | Admitting: Internal Medicine

## 2022-12-26 DIAGNOSIS — Z1231 Encounter for screening mammogram for malignant neoplasm of breast: Secondary | ICD-10-CM

## 2023-01-04 DIAGNOSIS — Z23 Encounter for immunization: Secondary | ICD-10-CM | POA: Diagnosis not present

## 2023-02-08 ENCOUNTER — Ambulatory Visit
Admission: RE | Admit: 2023-02-08 | Discharge: 2023-02-08 | Disposition: A | Discharge status: Home / Self Care | Payer: BC Managed Care – PPO | Source: Ambulatory Visit | Attending: Internal Medicine | Admitting: Internal Medicine

## 2023-02-08 ENCOUNTER — Ambulatory Visit: Payer: BC Managed Care – PPO

## 2023-02-08 DIAGNOSIS — Z1231 Encounter for screening mammogram for malignant neoplasm of breast: Secondary | ICD-10-CM

## 2023-07-03 DIAGNOSIS — M79642 Pain in left hand: Secondary | ICD-10-CM | POA: Diagnosis not present

## 2023-07-03 DIAGNOSIS — M79641 Pain in right hand: Secondary | ICD-10-CM | POA: Diagnosis not present

## 2023-07-04 DIAGNOSIS — M79641 Pain in right hand: Secondary | ICD-10-CM | POA: Diagnosis not present

## 2023-07-26 DIAGNOSIS — M79641 Pain in right hand: Secondary | ICD-10-CM | POA: Diagnosis not present

## 2023-07-26 DIAGNOSIS — M79642 Pain in left hand: Secondary | ICD-10-CM | POA: Diagnosis not present

## 2024-01-08 ENCOUNTER — Encounter: Payer: Self-pay | Admitting: Internal Medicine

## 2024-01-08 DIAGNOSIS — Z1231 Encounter for screening mammogram for malignant neoplasm of breast: Secondary | ICD-10-CM

## 2024-01-10 ENCOUNTER — Other Ambulatory Visit: Payer: Self-pay | Admitting: Internal Medicine

## 2024-01-10 DIAGNOSIS — N644 Mastodynia: Secondary | ICD-10-CM

## 2024-02-04 ENCOUNTER — Other Ambulatory Visit: Payer: Self-pay | Admitting: Medical Genetics

## 2024-02-07 ENCOUNTER — Other Ambulatory Visit (HOSPITAL_COMMUNITY)
Admission: RE | Admit: 2024-02-07 | Discharge: 2024-02-07 | Disposition: A | Discharge status: Home / Self Care | Payer: Self-pay | Source: Ambulatory Visit | Attending: Medical Genetics | Admitting: Medical Genetics

## 2024-02-11 ENCOUNTER — Ambulatory Visit
Admission: RE | Admit: 2024-02-11 | Discharge: 2024-02-11 | Disposition: A | Discharge status: Home / Self Care | Source: Ambulatory Visit | Attending: Internal Medicine | Admitting: Internal Medicine

## 2024-02-11 ENCOUNTER — Ambulatory Visit: Admission: RE | Admit: 2024-02-11 | Source: Ambulatory Visit

## 2024-02-11 DIAGNOSIS — N644 Mastodynia: Secondary | ICD-10-CM

## 2024-02-19 LAB — GENECONNECT MOLECULAR SCREEN: Genetic Analysis Overall Interpretation: NEGATIVE
